# Patient Record
Sex: Female | Born: 1955 | Race: White | Hispanic: No | Marital: Married | Smoking: Former smoker
Health system: Southern US, Community
[De-identification: ages and names within clinical notes are randomized; demographics above are authoritative.]

## PROBLEM LIST (undated history)

## (undated) DIAGNOSIS — F419 Anxiety disorder, unspecified: Secondary | ICD-10-CM

## (undated) DIAGNOSIS — F329 Major depressive disorder, single episode, unspecified: Secondary | ICD-10-CM

## (undated) DIAGNOSIS — C801 Malignant (primary) neoplasm, unspecified: Secondary | ICD-10-CM

## (undated) DIAGNOSIS — M1990 Unspecified osteoarthritis, unspecified site: Secondary | ICD-10-CM

## (undated) DIAGNOSIS — K219 Gastro-esophageal reflux disease without esophagitis: Secondary | ICD-10-CM

## (undated) DIAGNOSIS — F32A Depression, unspecified: Secondary | ICD-10-CM

## (undated) HISTORY — PX: JOINT REPLACEMENT: SHX530

## (undated) HISTORY — PX: MENISCUS REPAIR: SHX5179

## (undated) HISTORY — PX: DILATION AND CURETTAGE OF UTERUS: SHX78

## (undated) HISTORY — PX: TUBAL LIGATION: SHX77

## (undated) HISTORY — PX: ABDOMINAL HYSTERECTOMY: SHX81

---

## 2012-09-17 ENCOUNTER — Other Ambulatory Visit: Payer: Self-pay | Admitting: Rheumatology

## 2012-09-17 DIAGNOSIS — L4050 Arthropathic psoriasis, unspecified: Secondary | ICD-10-CM

## 2012-09-20 ENCOUNTER — Ambulatory Visit
Admission: RE | Admit: 2012-09-20 | Discharge: 2012-09-20 | Disposition: A | Discharge status: Home / Self Care | Payer: BC Managed Care – PPO | Source: Ambulatory Visit | Attending: Rheumatology | Admitting: Rheumatology

## 2012-09-20 DIAGNOSIS — L4050 Arthropathic psoriasis, unspecified: Secondary | ICD-10-CM

## 2012-09-20 MED ORDER — GADOBENATE DIMEGLUMINE 529 MG/ML IV SOLN
17.0000 mL | Freq: Once | INTRAVENOUS | Status: AC | PRN
  Administered 2012-09-20: 17 mL via INTRAVENOUS

## 2013-02-07 ENCOUNTER — Other Ambulatory Visit: Payer: Self-pay

## 2013-02-07 DIAGNOSIS — Z1231 Encounter for screening mammogram for malignant neoplasm of breast: Secondary | ICD-10-CM

## 2013-03-01 ENCOUNTER — Ambulatory Visit
Admission: RE | Admit: 2013-03-01 | Discharge: 2013-03-01 | Disposition: A | Discharge status: Home / Self Care | Payer: BC Managed Care – PPO | Source: Ambulatory Visit

## 2013-03-01 DIAGNOSIS — Z1231 Encounter for screening mammogram for malignant neoplasm of breast: Secondary | ICD-10-CM

## 2013-10-16 ENCOUNTER — Encounter (HOSPITAL_COMMUNITY): Payer: Self-pay | Admitting: Pharmacy Technician

## 2013-10-17 NOTE — Pre-Procedure Instructions (Signed)
Rebecca Huff  10/17/2013   Your procedure is scheduled on:  Tuesday, October 29, 2013   Report to [facility] Short Stay (use Main Entrance "A'') at 5:30 AM.   Call this number if you have problems the morning of surgery: [phone]   Remember:   Do not eat food or drink liquids after midnight Monday.    Take these medicines the morning of surgery with A SIP OF WATER: sertraline (ZOLOFT) 50 MG tablet If needed:acetaminophen (TYLENOL) 500 MG tablet  for mild pain., traMADol (ULTRAM) 50 MG tablet for moderate pain. Stop taking Aspirin, vitamins and herbal medications. Do not take any NSAIDs ie: Ibuprofen, Advil, Naproxen or any medication containing Aspirin (diclofenac (VOLTAREN) 75 MG EC tablet)   Do not wear jewelry, make-up or nail polish.  Do not wear lotions, powders, or perfumes. You may wear deodorant.  Do not shave 48 hours prior to surgery.    Do not bring valuables to the hospital.  [facility] is not responsible for any belongings or valuables.               Contacts, dentures or bridgework may not be worn into surgery.  Leave suitcase in the car. After surgery it may be brought to your room.  For patients admitted to the hospital, discharge time is determined by your treatment team.               Patients discharged the day of surgery will not be allowed to drive home.   Name and phone number of your driver: MINNIE BELCHER -  MOM   Special Instructions: Shower using CHG 2 nights before surgery and the night before surgery.  If you shower the day of surgery use CHG.  Use special wash - you have one bottle of CHG for all showers.  You should use approximately 1/3 of the bottle for each shower.   Please read over the following fact sheets that you were given: Pain Booklet, Coughing and Deep Breathing, Blood Transfusion Information, Total Joint Packet, MRSA Information and Surgical Site Infection Prevention

## 2013-10-17 NOTE — H&P (Signed)
CHIEF COMPLAINT:  Painful left knee.   HISTORY OF PRESENT ILLNESS:  Rebecca Huff is a very pleasant 57 year old white female who is seen today for evaluation of her left knee.  She has undergone a previous arthroscopy in December of 2013 with a medial and lateral partial meniscectomy, but she had a pretty significant osteoarthritis at that time more medial than lateral.  Initially she did well.  She also used Voltaren gel which was also beneficial, but after continued use it had become ineffective.  She has had cortisone injections performed and also that of viscosupplementation.  She has even tried Humira and Enbrel in the past.  She has undergone multiple treatments and multiple dosage of the viscosupplementation and cortisone.  Most recently though she is refractory to almost any type of treatment.  She is not having pain with every step.  Nighttime pain keeps her wake.  She also has pain with activities of daily living.  She has also noted that she does not have quite as much range of motion.  She is not having swelling.  Her pain is moderately severe, has more of an aching, throbbing pain with occasional catching of the knee.  She is seen today for re-evaluation.     Her health history and general health is good.     PAST SURGICAL HISTORY:   In 1985 and 1988 cesarean sections.  Total abdominal hysterectomy with bilateral salpingo-oophorectomy in 2004.  November 22, 2012 left knee arthroscopy with debridement.     HOSPITALIZATIONS:   In 1990 for a kidney infection.   CURRENT MEDICATIONS:   Zoloft 50 mg daily, lorazepam 0.5 mg 1-1/2 tabs HS, diclofenac 75 mg b.i.d., tramadol 50 mg 2-3 times per week p.r.n., Nicotrol inhaler she uses occasionally.  Tylenol p.r.n.   ALLERGIES:   Sulfa which gives her hives and an itch.   REVIEW OF SYSTEMS:   A 14-point review of systems positive for glasses and occasional leg cramps.  She also has some problems with constipation.  She does have hot flashes at times.  She  also noticed some weight gain.  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     FAMILY HISTORY:   A mother is still alive at age 103 who has had a microinfarction in the past as well as hypertension.  Biologic father died at age 74 from a logging accident.  She has no brothers.  She has 3 half sisters age 3, 48, 50.  The 10 year old sister has had breast cancer.     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  She smokes now less than 6 cigarettes per day and she smoked a total of 35 years.  She drinks 1-2 times a week mainly that of red wine.     PHYSICAL EXAMINATION:  A 57 year old white female, well-developed, well-nourished, alert, pleasant, and cooperative in moderate distress secondary to left knee pain.     Height is 68 inches.  Weight is 191 pounds.  BMI is 29.     Vital signs reveal a temperature of 98, pulse 96, respirations 16, blood pressure 126/84.     Head is normocephalic.   Eyes:  Pupils equal, round, reactive to light and accommodation with extraocular movements intact.  Ears, nose and throat were benign. Chest had good expansion. Lungs are clear.   Cardiac had a regular rhythm and rate S1, S2 and no murmurs noted. Pulses were 2+ bilateral and symmetric in  the lower extremities. CNS:   She is oriented x3 and cranial nerves II-XII grossly intact. Abdomen:  Scaphoid is soft, nontender, no mass is palpable.  Normal bowel sounds are present. Genital, rectal and breast exam was not indicated for a orthopedic evaluation.   Musculoskeletal:  She has range of motion from about 2-3 degrees shy of full extension to about 110 degrees.  She does have crepitus with range of motion.  Diffuse tenderness about the knee.  More medial compartment than lateral.  She does have some pseudolaxity with varus and valgus stressing.     CLINICAL IMPRESSION:   1.  End-stage osteoarthritis of the left knee medial greater than  lateral. 2.  Cigarette smoker.   RECOMMENDATIONS:  At this time I reviewed a evaluation from [facility] that of Dr. Laurann Huff who felt that she was a candidate for total joint replacement from medical and cardiac clearance.  At this time include that of a left total knee replacement since she has failed conservative treatment.  The procedure, risks and benefits were fully explained to her and she is understanding.  I have demonstrated the surgical intervention using the prosthesis and the knee model.  All questions were answered.  I went over the entire hospital stay.  She is understanding.  All questions were answered.  Plan for left total knee arthroplasty in the near future.    Oris Drone Aleda Grana [facility] [phone]  10/17/2013 4:14 PM

## 2013-10-18 ENCOUNTER — Encounter (HOSPITAL_COMMUNITY)
Admission: RE | Admit: 2013-10-18 | Discharge: 2013-10-18 | Disposition: A | Discharge status: Home / Self Care | Payer: BC Managed Care – PPO | Source: Ambulatory Visit | Attending: Orthopedic Surgery | Admitting: Orthopedic Surgery

## 2013-10-18 ENCOUNTER — Encounter (HOSPITAL_COMMUNITY)
Admission: RE | Admit: 2013-10-18 | Discharge: 2013-10-18 | Disposition: A | Discharge status: Home / Self Care | Payer: BC Managed Care – PPO | Source: Ambulatory Visit | Attending: Orthopaedic Surgery | Admitting: Orthopaedic Surgery

## 2013-10-18 ENCOUNTER — Encounter (HOSPITAL_COMMUNITY): Payer: Self-pay

## 2013-10-18 DIAGNOSIS — Z01818 Encounter for other preprocedural examination: Secondary | ICD-10-CM | POA: Insufficient documentation

## 2013-10-18 DIAGNOSIS — Z01812 Encounter for preprocedural laboratory examination: Secondary | ICD-10-CM | POA: Insufficient documentation

## 2013-10-18 DIAGNOSIS — Z01810 Encounter for preprocedural cardiovascular examination: Secondary | ICD-10-CM | POA: Insufficient documentation

## 2013-10-18 HISTORY — DX: Anxiety disorder, unspecified: F41.9

## 2013-10-18 HISTORY — DX: Unspecified osteoarthritis, unspecified site: M19.90

## 2013-10-18 LAB — COMPREHENSIVE METABOLIC PANEL
AST: 24 U/L (ref 0–37)
Albumin: 3.8 g/dL (ref 3.5–5.2)
Alkaline Phosphatase: 80 U/L (ref 39–117)
BUN: 17 mg/dL (ref 6–23)
Calcium: 9.1 mg/dL (ref 8.4–10.5)
Creatinine, Ser: 0.78 mg/dL (ref 0.50–1.10)
GFR calc Af Amer: >90 mL/min (ref >90)
Glucose, Bld: 100 mg/dL — ABNORMAL HIGH (ref 70–99)
Potassium: 4 mEq/L (ref 3.5–5.1)
Sodium: 141 mEq/L (ref 135–145)
Total Protein: 7.1 g/dL (ref 6.0–8.3)

## 2013-10-18 LAB — PROTIME-INR: INR: 0.91 (ref 0.00–1.49)

## 2013-10-18 LAB — URINALYSIS, ROUTINE W REFLEX MICROSCOPIC
Bilirubin Urine: NEGATIVE
Glucose, UA: NEGATIVE mg/dL
Hgb urine dipstick: NEGATIVE
Ketones, ur: NEGATIVE mg/dL
Leukocytes, UA: NEGATIVE
Urobilinogen, UA: 0.2 mg/dL (ref 0.0–1.0)
pH: 5 (ref 5.0–8.0)

## 2013-10-18 LAB — SURGICAL PCR SCREEN: Staphylococcus aureus: NEGATIVE

## 2013-10-18 LAB — CBC
HCT: 41.8 % (ref 36.0–46.0)
Hemoglobin: 14.5 g/dL (ref 12.0–15.0)
MCH: 31.5 pg (ref 26.0–34.0)
MCHC: 34.7 g/dL (ref 30.0–36.0)
MCV: 90.7 fL (ref 78.0–100.0)
Platelets: 225 10*3/uL (ref 150–400)
RBC: 4.61 MIL/uL (ref 3.87–5.11)
RDW: 12.7 % (ref 11.5–15.5)
WBC: 6.2 10*3/uL (ref 4.0–10.5)

## 2013-10-19 LAB — URINE CULTURE
Colony Count: NO GROWTH
Culture: NO GROWTH

## 2013-10-28 MED ORDER — CEFAZOLIN SODIUM-DEXTROSE 2-3 GM-% IV SOLR
2.0000 g | INTRAVENOUS | Status: AC
  Administered 2013-10-29: 2 g via INTRAVENOUS
  Filled 2013-10-28: qty 50

## 2013-10-29 ENCOUNTER — Inpatient Hospital Stay (HOSPITAL_COMMUNITY): Payer: BC Managed Care – PPO | Admitting: Anesthesiology

## 2013-10-29 ENCOUNTER — Encounter (HOSPITAL_COMMUNITY): Payer: BC Managed Care – PPO | Admitting: Anesthesiology

## 2013-10-29 ENCOUNTER — Inpatient Hospital Stay (HOSPITAL_COMMUNITY)
Admission: RE | Admit: 2013-10-29 | Discharge: 2013-10-31 | DRG: 470 | Disposition: A | Discharge status: Home / Self Care | Payer: BC Managed Care – PPO | Source: Ambulatory Visit | Attending: Orthopaedic Surgery | Admitting: Orthopaedic Surgery

## 2013-10-29 ENCOUNTER — Encounter (HOSPITAL_COMMUNITY)
Admission: RE | Disposition: A | Discharge status: Home / Self Care | Payer: Self-pay | Source: Ambulatory Visit | Attending: Orthopaedic Surgery

## 2013-10-29 ENCOUNTER — Encounter (HOSPITAL_COMMUNITY): Payer: Self-pay | Admitting: Surgery

## 2013-10-29 DIAGNOSIS — F411 Generalized anxiety disorder: Secondary | ICD-10-CM | POA: Diagnosis present

## 2013-10-29 DIAGNOSIS — Z79899 Other long term (current) drug therapy: Secondary | ICD-10-CM

## 2013-10-29 DIAGNOSIS — Z882 Allergy status to sulfonamides status: Secondary | ICD-10-CM

## 2013-10-29 DIAGNOSIS — Z803 Family history of malignant neoplasm of breast: Secondary | ICD-10-CM

## 2013-10-29 DIAGNOSIS — Z8249 Family history of ischemic heart disease and other diseases of the circulatory system: Secondary | ICD-10-CM

## 2013-10-29 DIAGNOSIS — M1710 Unilateral primary osteoarthritis, unspecified knee: Principal | ICD-10-CM | POA: Diagnosis present

## 2013-10-29 DIAGNOSIS — F17200 Nicotine dependence, unspecified, uncomplicated: Secondary | ICD-10-CM | POA: Diagnosis present

## 2013-10-29 DIAGNOSIS — M1712 Unilateral primary osteoarthritis, left knee: Secondary | ICD-10-CM

## 2013-10-29 DIAGNOSIS — Z7901 Long term (current) use of anticoagulants: Secondary | ICD-10-CM

## 2013-10-29 DIAGNOSIS — Z96659 Presence of unspecified artificial knee joint: Secondary | ICD-10-CM

## 2013-10-29 HISTORY — PX: TOTAL KNEE ARTHROPLASTY: SHX125

## 2013-10-29 SURGERY — ARTHROPLASTY, KNEE, TOTAL
Anesthesia: Regional | Site: Knee | Laterality: Left

## 2013-10-29 MED ORDER — ONDANSETRON HCL 4 MG PO TABS: 4.0000 mg | ORAL_TABLET | Freq: Four times a day (QID) | ORAL | Status: DC | PRN

## 2013-10-29 MED ORDER — LIDOCAINE HCL (CARDIAC) 20 MG/ML IV SOLN
INTRAVENOUS | Status: DC | PRN
  Administered 2013-10-29: 80 mg via INTRAVENOUS

## 2013-10-29 MED ORDER — ACETAMINOPHEN 325 MG PO TABS
650.0000 mg | ORAL_TABLET | Freq: Four times a day (QID) | ORAL | Status: DC | PRN
  Administered 2013-10-30 – 2013-10-31 (×2): 650 mg via ORAL
  Filled 2013-10-29 (×2): qty 2

## 2013-10-29 MED ORDER — CEFAZOLIN SODIUM-DEXTROSE 2-3 GM-% IV SOLR
2.0000 g | Freq: Four times a day (QID) | INTRAVENOUS | Status: AC
  Administered 2013-10-29 (×2): 2 g via INTRAVENOUS
  Filled 2013-10-29 (×3): qty 50

## 2013-10-29 MED ORDER — LACTATED RINGERS IV SOLN
INTRAVENOUS | Status: DC | PRN
  Administered 2013-10-29 (×2): via INTRAVENOUS

## 2013-10-29 MED ORDER — MAGNESIUM HYDROXIDE 400 MG/5ML PO SUSP: 30.0000 mL | Freq: Every day | ORAL | Status: DC | PRN

## 2013-10-29 MED ORDER — KETOROLAC TROMETHAMINE 15 MG/ML IJ SOLN
15.0000 mg | Freq: Four times a day (QID) | INTRAMUSCULAR | Status: AC
  Administered 2013-10-29 (×2): 15 mg via INTRAVENOUS
  Filled 2013-10-29 (×2): qty 1

## 2013-10-29 MED ORDER — METHOCARBAMOL 500 MG PO TABS
500.0000 mg | ORAL_TABLET | Freq: Four times a day (QID) | ORAL | Status: DC | PRN
  Administered 2013-10-29 – 2013-10-31 (×8): 500 mg via ORAL
  Filled 2013-10-29 (×10): qty 1

## 2013-10-29 MED ORDER — FENTANYL CITRATE 0.05 MG/ML IJ SOLN
INTRAMUSCULAR | Status: DC | PRN
  Administered 2013-10-29: 50 ug via INTRAVENOUS
  Administered 2013-10-29 (×8): 25 ug via INTRAVENOUS
  Administered 2013-10-29: 50 ug via INTRAVENOUS

## 2013-10-29 MED ORDER — RIVAROXABAN 10 MG PO TABS
10.0000 mg | ORAL_TABLET | Freq: Every day | ORAL | Status: DC
  Administered 2013-10-29 – 2013-10-30 (×2): 10 mg via ORAL
  Filled 2013-10-29 (×3): qty 1

## 2013-10-29 MED ORDER — LORAZEPAM 0.5 MG PO TABS: 0.7500 mg | ORAL_TABLET | Freq: Every evening | ORAL | Status: DC | PRN

## 2013-10-29 MED ORDER — ACETAMINOPHEN 500 MG PO TABS
1000.0000 mg | ORAL_TABLET | Freq: Once | ORAL | Status: AC
  Administered 2013-10-29: 1000 mg via ORAL
  Filled 2013-10-29: qty 2

## 2013-10-29 MED ORDER — METHOCARBAMOL 500 MG PO TABS
ORAL_TABLET | ORAL | Status: AC
  Administered 2013-10-29: 500 mg
  Filled 2013-10-29: qty 1

## 2013-10-29 MED ORDER — HYDROMORPHONE HCL PF 1 MG/ML IJ SOLN
INTRAMUSCULAR | Status: AC
  Administered 2013-10-29: 11:00:00
  Filled 2013-10-29: qty 1

## 2013-10-29 MED ORDER — OXYCODONE HCL 5 MG PO TABS
ORAL_TABLET | ORAL | Status: AC
  Administered 2013-10-29: 11:00:00
  Filled 2013-10-29: qty 1

## 2013-10-29 MED ORDER — ALUM & MAG HYDROXIDE-SIMETH 200-200-20 MG/5ML PO SUSP: 30.0000 mL | ORAL | Status: DC | PRN

## 2013-10-29 MED ORDER — CHLORHEXIDINE GLUCONATE 4 % EX LIQD: 60.0000 mL | Freq: Every day | CUTANEOUS | Status: DC

## 2013-10-29 MED ORDER — MENTHOL 3 MG MT LOZG: 1.0000 | LOZENGE | OROMUCOSAL | Status: DC | PRN

## 2013-10-29 MED ORDER — FLEET ENEMA 7-19 GM/118ML RE ENEM: 1.0000 | ENEMA | Freq: Once | RECTAL | Status: AC | PRN

## 2013-10-29 MED ORDER — PHENOL 1.4 % MT LIQD: 1.0000 | OROMUCOSAL | Status: DC | PRN

## 2013-10-29 MED ORDER — ONDANSETRON HCL 4 MG/2ML IJ SOLN: 4.0000 mg | Freq: Four times a day (QID) | INTRAMUSCULAR | Status: DC | PRN

## 2013-10-29 MED ORDER — BUPIVACAINE-EPINEPHRINE 0.25% -1:200000 IJ SOLN
INTRAMUSCULAR | Status: DC | PRN
  Administered 2013-10-29: 30 mL

## 2013-10-29 MED ORDER — SODIUM CHLORIDE 0.9 % IR SOLN
Status: DC | PRN
  Administered 2013-10-29: 3000 mL

## 2013-10-29 MED ORDER — HYDROMORPHONE HCL PF 1 MG/ML IJ SOLN
0.5000 mg | INTRAMUSCULAR | Status: DC | PRN
  Administered 2013-10-29 (×2): 0.5 mg via INTRAVENOUS
  Filled 2013-10-29 (×3): qty 1

## 2013-10-29 MED ORDER — ONDANSETRON HCL 4 MG/2ML IJ SOLN
INTRAMUSCULAR | Status: DC | PRN
  Administered 2013-10-29: 4 mg via INTRAVENOUS

## 2013-10-29 MED ORDER — METOCLOPRAMIDE HCL 5 MG/ML IJ SOLN: 5.0000 mg | Freq: Three times a day (TID) | INTRAMUSCULAR | Status: DC | PRN

## 2013-10-29 MED ORDER — SERTRALINE HCL 50 MG PO TABS
50.0000 mg | ORAL_TABLET | Freq: Every day | ORAL | Status: DC
  Administered 2013-10-29 – 2013-10-31 (×2): 50 mg via ORAL
  Filled 2013-10-29 (×3): qty 1

## 2013-10-29 MED ORDER — ARTIFICIAL TEARS OP OINT
TOPICAL_OINTMENT | OPHTHALMIC | Status: DC | PRN
  Administered 2013-10-29: 1 via OPHTHALMIC

## 2013-10-29 MED ORDER — DOCUSATE SODIUM 100 MG PO CAPS
100.0000 mg | ORAL_CAPSULE | Freq: Two times a day (BID) | ORAL | Status: DC
  Administered 2013-10-29 – 2013-10-31 (×5): 100 mg via ORAL
  Filled 2013-10-29 (×7): qty 1

## 2013-10-29 MED ORDER — BUPIVACAINE-EPINEPHRINE (PF) 0.25% -1:200000 IJ SOLN
INTRAMUSCULAR | Status: AC
  Filled 2013-10-29: qty 30

## 2013-10-29 MED ORDER — BISACODYL 10 MG RE SUPP: 10.0000 mg | Freq: Every day | RECTAL | Status: DC | PRN

## 2013-10-29 MED ORDER — DEXTROSE 5 % IV SOLN
500.0000 mg | Freq: Four times a day (QID) | INTRAVENOUS | Status: DC | PRN
  Filled 2013-10-29: qty 5

## 2013-10-29 MED ORDER — OXYCODONE HCL 5 MG PO TABS
5.0000 mg | ORAL_TABLET | ORAL | Status: DC | PRN
  Administered 2013-10-29 (×2): 5 mg via ORAL
  Administered 2013-10-30 – 2013-10-31 (×10): 10 mg via ORAL
  Filled 2013-10-29 (×9): qty 2
  Filled 2013-10-29: qty 1
  Filled 2013-10-29 (×3): qty 2

## 2013-10-29 MED ORDER — OXYCODONE HCL 5 MG/5ML PO SOLN: 5.0000 mg | Freq: Once | ORAL | Status: AC | PRN

## 2013-10-29 MED ORDER — CHLORHEXIDINE GLUCONATE 4 % EX LIQD: 60.0000 mL | Freq: Once | CUTANEOUS | Status: DC

## 2013-10-29 MED ORDER — KETOROLAC TROMETHAMINE 30 MG/ML IJ SOLN
INTRAMUSCULAR | Status: AC
  Administered 2013-10-29: 30 mg
  Filled 2013-10-29: qty 1

## 2013-10-29 MED ORDER — SODIUM CHLORIDE 0.9 % IV SOLN
75.0000 mL/h | INTRAVENOUS | Status: DC
  Administered 2013-10-29 – 2013-10-30 (×2): 75 mL/h via INTRAVENOUS

## 2013-10-29 MED ORDER — OXYCODONE HCL 5 MG PO TABS
5.0000 mg | ORAL_TABLET | Freq: Once | ORAL | Status: AC | PRN
  Administered 2013-10-29: 5 mg via ORAL

## 2013-10-29 MED ORDER — METOCLOPRAMIDE HCL 10 MG PO TABS: 5.0000 mg | ORAL_TABLET | Freq: Three times a day (TID) | ORAL | Status: DC | PRN

## 2013-10-29 MED ORDER — SODIUM CHLORIDE 0.9 % IV SOLN: INTRAVENOUS | Status: DC

## 2013-10-29 MED ORDER — MIDAZOLAM HCL 5 MG/5ML IJ SOLN
INTRAMUSCULAR | Status: DC | PRN
  Administered 2013-10-29 (×2): 1 mg via INTRAVENOUS

## 2013-10-29 MED ORDER — PHENYLEPHRINE HCL 10 MG/ML IJ SOLN
INTRAMUSCULAR | Status: DC | PRN
  Administered 2013-10-29: 40 ug via INTRAVENOUS

## 2013-10-29 MED ORDER — HYDROMORPHONE HCL PF 1 MG/ML IJ SOLN
0.2500 mg | INTRAMUSCULAR | Status: DC | PRN
  Administered 2013-10-29 (×2): 0.5 mg via INTRAVENOUS

## 2013-10-29 MED ORDER — PROPOFOL 10 MG/ML IV BOLUS
INTRAVENOUS | Status: DC | PRN
  Administered 2013-10-29: 170 mg via INTRAVENOUS

## 2013-10-29 MED ORDER — ACETAMINOPHEN 650 MG RE SUPP: 650.0000 mg | Freq: Four times a day (QID) | RECTAL | Status: DC | PRN

## 2013-10-29 SURGICAL SUPPLY — 58 items
BANDAGE ESMARK 6X9 LF (GAUZE/BANDAGES/DRESSINGS) ×1 IMPLANT
BLADE SAGITTAL 25.0X1.19X90 (BLADE) ×2 IMPLANT
BNDG ESMARK 6X9 LF (GAUZE/BANDAGES/DRESSINGS) ×2
BOWL SMART MIX CTS (DISPOSABLE) ×2 IMPLANT
CAPT RP KNEE ×2 IMPLANT
CEMENT HV SMART SET (Cement) ×4 IMPLANT
CLOTH BEACON ORANGE TIMEOUT ST (SAFETY) ×2 IMPLANT
COVER BACK TABLE 24X17X13 BIG (DRAPES) ×2 IMPLANT
COVER SURGICAL LIGHT HANDLE (MISCELLANEOUS) ×2 IMPLANT
CUFF TOURNIQUET SINGLE 34IN LL (TOURNIQUET CUFF) IMPLANT
CUFF TOURNIQUET SINGLE 44IN (TOURNIQUET CUFF) IMPLANT
DRAPE EXTREMITY T 121X128X90 (DRAPE) ×2 IMPLANT
DRAPE PROXIMA HALF (DRAPES) ×2 IMPLANT
DRSG ADAPTIC 3X8 NADH LF (GAUZE/BANDAGES/DRESSINGS) ×2 IMPLANT
DRSG PAD ABDOMINAL 8X10 ST (GAUZE/BANDAGES/DRESSINGS) ×2 IMPLANT
DURAPREP 26ML APPLICATOR (WOUND CARE) ×2 IMPLANT
ELECT CAUTERY BLADE 6.4 (BLADE) ×2 IMPLANT
ELECT REM PT RETURN 9FT ADLT (ELECTROSURGICAL) ×2
ELECTRODE REM PT RTRN 9FT ADLT (ELECTROSURGICAL) ×1 IMPLANT
EVACUATOR 1/8 PVC DRAIN (DRAIN) IMPLANT
FACESHIELD LNG OPTICON STERILE (SAFETY) ×4 IMPLANT
FLOSEAL 10ML (HEMOSTASIS) IMPLANT
GLOVE BIOGEL PI IND STRL 8 (GLOVE) ×1 IMPLANT
GLOVE BIOGEL PI IND STRL 8.5 (GLOVE) ×1 IMPLANT
GLOVE BIOGEL PI INDICATOR 8 (GLOVE) ×1
GLOVE BIOGEL PI INDICATOR 8.5 (GLOVE) ×1
GLOVE ECLIPSE 8.0 STRL XLNG CF (GLOVE) ×6 IMPLANT
GLOVE SURG ORTHO 8.5 STRL (GLOVE) ×6 IMPLANT
GOWN PREVENTION PLUS XXLARGE (GOWN DISPOSABLE) ×2 IMPLANT
GOWN STRL NON-REIN LRG LVL3 (GOWN DISPOSABLE) ×4 IMPLANT
HANDPIECE INTERPULSE COAX TIP (DISPOSABLE) ×1
KIT BASIN OR (CUSTOM PROCEDURE TRAY) ×2 IMPLANT
KIT ROOM TURNOVER OR (KITS) ×2 IMPLANT
MANIFOLD NEPTUNE II (INSTRUMENTS) ×2 IMPLANT
NEEDLE 22X1 1/2 (OR ONLY) (NEEDLE) IMPLANT
NS IRRIG 1000ML POUR BTL (IV SOLUTION) ×2 IMPLANT
PACK TOTAL JOINT (CUSTOM PROCEDURE TRAY) ×2 IMPLANT
PAD ARMBOARD 7.5X6 YLW CONV (MISCELLANEOUS) ×4 IMPLANT
PAD CAST 4YDX4 CTTN HI CHSV (CAST SUPPLIES) ×1 IMPLANT
PADDING CAST COTTON 4X4 STRL (CAST SUPPLIES) ×1
PADDING CAST COTTON 6X4 STRL (CAST SUPPLIES) ×2 IMPLANT
SET HNDPC FAN SPRY TIP SCT (DISPOSABLE) ×1 IMPLANT
SPONGE GAUZE 4X4 12PLY (GAUZE/BANDAGES/DRESSINGS) ×2 IMPLANT
STAPLER VISISTAT 35W (STAPLE) ×2 IMPLANT
SUCTION FRAZIER TIP 10 FR DISP (SUCTIONS) ×2 IMPLANT
SUT BONE WAX W31G (SUTURE) ×2 IMPLANT
SUT ETHIBOND NAB CT1 #1 30IN (SUTURE) ×6 IMPLANT
SUT MNCRL AB 3-0 PS2 18 (SUTURE) ×2 IMPLANT
SUT VIC AB 0 CT1 27 (SUTURE) ×1
SUT VIC AB 0 CT1 27XBRD ANBCTR (SUTURE) ×1 IMPLANT
SUT VIC AB 1 CT1 27 (SUTURE) ×1
SUT VIC AB 1 CT1 27XBRD ANBCTR (SUTURE) ×1 IMPLANT
SYR CONTROL 10ML LL (SYRINGE) IMPLANT
TOWEL OR 17X24 6PK STRL BLUE (TOWEL DISPOSABLE) ×2 IMPLANT
TOWEL OR 17X26 10 PK STRL BLUE (TOWEL DISPOSABLE) ×2 IMPLANT
TRAY FOLEY CATH 16FRSI W/METER (SET/KITS/TRAYS/PACK) IMPLANT
WATER STERILE IRR 1000ML POUR (IV SOLUTION) ×4 IMPLANT
WRAP KNEE MAXI GEL POST OP (GAUZE/BANDAGES/DRESSINGS) ×2 IMPLANT

## 2013-10-29 NOTE — Anesthesia Procedure Notes (Addendum)
Anesthesia Regional Block:  Femoral nerve block  Pre-Anesthetic Checklist: ,, timeout performed, Correct Patient, Correct Site, Correct Laterality, Correct Procedure, Correct Position, site marked, Risks and benefits discussed,  Surgical consent,  Pre-op evaluation,  At surgeon's request and post-op pain management  Laterality: Left  Prep: chloraprep       Needles:  Injection technique: Single-shot  Needle Type: Echogenic Stimulator Needle     Needle Length: 5cm 5 cm Needle Gauge: 22 and 22 G    Additional Needles:  Procedures: ultrasound guided (picture in chart) and nerve stimulator Femoral nerve block  Nerve Stimulator or Paresthesia:  Response: quadraceps contraction, 0.45 mA,   Additional Responses:   Narrative:  Start time: 10/29/2013 6:52 AM End time: 10/29/2013 7:02 AM Injection made incrementally with aspirations every 5 mL.  Performed by: Personally  Anesthesiologist: Halford Decamp, MD  Additional Notes: Functioning IV was confirmed and monitors were applied.  A 50mm 22ga Arrow echogenic stimulator needle was used. Sterile prep and drape,hand hygiene and sterile gloves were used. Ultrasound guidance: relevant anatomy identified, needle position confirmed, local anesthetic spread visualized around nerve(s)., vascular puncture avoided.  Image printed for medical record. Negative aspiration and negative test dose prior to incremental administration of local anesthetic. The patient tolerated the procedure well.     Anesthesia Procedure Image  Procedure Name: LMA Insertion Date/Time: 10/29/2013 7:35 AM Performed by: Gayla Medicus Pre-anesthesia Checklist: Patient identified, Timeout performed, Emergency Drugs available, Suction available and Patient being monitored Patient Re-evaluated:Patient Re-evaluated prior to inductionOxygen Delivery Method: Circle system utilized Preoxygenation: Pre-oxygenation with 100% oxygen Intubation Type: IV induction LMA: LMA  inserted LMA Size: 4.0 Number of attempts: 1 Placement Confirmation: positive ETCO2 and breath sounds checked- equal and bilateral Tube secured with: Tape Dental Injury: Teeth and Oropharynx as per pre-operative assessment

## 2013-10-29 NOTE — Progress Notes (Signed)
Orthopedic Tech Progress Note Patient Details:  Rebecca Huff [birth year] [id number] CPM applied to Left LE with appropriate settings. OHF applied to bed. Footsie roll provided.  CPM Left Knee CPM Left Knee: On Left Knee Flexion (Degrees): 60 Left Knee Extension (Degrees): 0   Asia R Thompson 10/29/2013, 10:50 AM

## 2013-10-29 NOTE — Evaluation (Signed)
Physical Therapy Evaluation Patient Details Name: Rebecca Huff MRN: [id number] DOB: [birth year] Today's Date: 10/29/2013 Time: 1660-6301 PT Time Calculation (min): 27 min  PT Assessment / Plan / Recommendation History of Present Illness  S/p LTKA; 50% PWB  Clinical Impression  Pt is s/p TKA resulting in the deficits listed below (see PT Problem List).  Pt will benefit from skilled PT to increase their independence and safety with mobility to allow discharge to the venue listed below.      PT Assessment  Patient needs continued PT services    Follow Up Recommendations  Home health PT;Supervision/Assistance - 24 hour    Does the patient have the potential to tolerate intense rehabilitation      Barriers to Discharge        Equipment Recommendations  3in1 (PT)    Recommendations for Other Services OT consult   Frequency 7X/week    Precautions / Restrictions Precautions Precautions: Knee Restrictions Weight Bearing Restrictions: Yes LLE Weight Bearing: Partial weight bearing LLE Partial Weight Bearing Percentage or Pounds: 50   Pertinent Vitals/Pain 5/10 pain with moving; was premedicated patient repositioned for comfort and optimal knee extension      Mobility  Bed Mobility Bed Mobility: Supine to Sit;Sitting - Scoot to Edge of Bed Supine to Sit: 4: Min assist Sitting - Scoot to [place] of Bed: 4: Min assist Details for Bed Mobility Assistance: Cues for technique, and physical assist to help LLE off of bed Transfers Transfers: Sit to Stand;Stand to Sit Sit to Stand: 3: Mod assist Stand to Sit: 4: Min assist Details for Transfer Assistance: Cues for safety and hand placement Ambulation/Gait Ambulation/Gait Assistance: 4: Min assist Ambulation Distance (Feet): 3 Feet Assistive device: Rolling walker Ambulation/Gait Assistance Details: pivot steps bed to recliner; cues for gait sequence and 50%PWB Gait Pattern: Step-to pattern    Exercises Total Joint  Exercises Quad Sets: AROM;Left;5 reps Straight Leg Raises: AAROM;Left;Other reps (comment) (3)   PT Diagnosis: Difficulty walking;Acute pain  PT Problem List: Decreased strength;Decreased range of motion;Decreased activity tolerance;Decreased knowledge of use of DME;Decreased knowledge of precautions;Pain PT Treatment Interventions: DME instruction;Gait training;Stair training;Functional mobility training;Therapeutic activities;Therapeutic exercise;Patient/family education     PT Goals(Current goals can be found in the care plan section) Acute Rehab PT Goals Patient Stated Goal: get back to doing whatever she wants PT Goal Formulation: With patient Time For Goal Achievement: 11/05/13 Potential to Achieve Goals: Good  Visit Information  Last PT Received On: 10/29/13 Assistance Needed: +1 History of Present Illness: S/p LTKA; 50% PWB       Prior Functioning  Home Living Family/patient expects to be discharged to:: Private residence Living Arrangements: Children Available Help at Discharge: Family;Available 24 hours/day Type of Home: Apartment Home Access: Stairs to enter Entrance Stairs-Number of Steps: 1 Entrance Stairs-Rails: None Home Layout: One level Home Equipment: Walker - 2 wheels;Tub bench Prior Function Level of Independence: Independent Communication Communication: No difficulties    Cognition  Cognition Arousal/Alertness: Awake/alert Behavior During Therapy: WFL for tasks assessed/performed Overall Cognitive Status: Within Functional Limits for tasks assessed    Extremity/Trunk Assessment Upper Extremity Assessment Upper Extremity Assessment: Overall WFL for tasks assessed Lower Extremity Assessment Lower Extremity Assessment: LLE deficits/detail LLE Deficits / Details: Grossly decr ROM and strength, limited by pain and nerve block   Balance    End of Session PT - End of Session Equipment Utilized During Treatment: Gait belt Activity Tolerance: Patient  tolerated treatment well Patient left: in chair;with call bell/phone within reach;with family/visitor  present Nurse Communication: Mobility status CPM Left Knee CPM Left Knee: On  GP     Olen Pel [place], [place] [phone]  10/29/2013, 4:26 PM

## 2013-10-29 NOTE — Care Management Note (Signed)
Case Manager spoke with patient and family concerning Home Health and Equipment needs.Patient states she will be staying local for recovery. Daughter April Robertson-[phone], address [address] . Daughter states they have borrowed walker, 3in1 and have the CPM.

## 2013-10-29 NOTE — Plan of Care (Signed)
Problem: Consults Goal: Diagnosis- Total Joint Replacement Outcome: Completed/Met Date Met:  10/29/13 Primary Total Knee

## 2013-10-29 NOTE — Anesthesia Postprocedure Evaluation (Signed)
Anesthesia Post Note  Patient: Rebecca Huff  Procedure(s) Performed: Procedure(s) (LRB): LEFT TOTAL KNEE ARTHROPLASTY (Left)  Anesthesia type: General  Patient location: PACU  Post pain: Pain level controlled and Adequate analgesia  Post assessment: Post-op Vital signs reviewed, Patient's Cardiovascular Status Stable, Respiratory Function Stable, Patent Airway and Pain level controlled  Last Vitals:  Filed Vitals:   10/29/13 1030  BP:   Pulse: 83  Temp:   Resp: 16    Post vital signs: Reviewed and stable  Level of consciousness: awake, alert  and oriented  Complications: No apparent anesthesia complications

## 2013-10-29 NOTE — Preoperative (Signed)
Beta Blockers   Reason not to administer Beta Blockers:Not Applicable 

## 2013-10-29 NOTE — Anesthesia Preprocedure Evaluation (Signed)
Anesthesia Evaluation  Patient identified by MRN, date of birth, ID band Patient awake    Reviewed: Allergy & Precautions, H&P , NPO status , Patient's Chart, lab work & pertinent test results  Airway Mallampati: II  Neck ROM: full    Dental   Pulmonary Current Smoker,          Cardiovascular negative cardio ROS      Neuro/Psych Anxiety    GI/Hepatic   Endo/Other    Renal/GU      Musculoskeletal  (+) Arthritis -,   Abdominal   Peds  Hematology   Anesthesia Other Findings   Reproductive/Obstetrics                           Anesthesia Physical Anesthesia Plan  ASA: II  Anesthesia Plan: General and Regional   Post-op Pain Management: MAC Combined w/ Regional for Post-op pain   Induction: Intravenous  Airway Management Planned: LMA  Additional Equipment:   Intra-op Plan:   Post-operative Plan:   Informed Consent: I have reviewed the patients History and Physical, chart, labs and discussed the procedure including the risks, benefits and alternatives for the proposed anesthesia with the patient or authorized representative who has indicated his/her understanding and acceptance.     Plan Discussed with: CRNA, Anesthesiologist and Surgeon  Anesthesia Plan Comments:         Anesthesia Quick Evaluation

## 2013-10-29 NOTE — Op Note (Signed)
PATIENT ID:      Rebecca Huff  MRN:     [id number] DOB/AGE:    01/13/1956 / 57 y.o.       OPERATIVE REPORT    DATE OF PROCEDURE:  10/29/2013       PREOPERATIVE DIAGNOSIS:   LEFT KNEE OSTEOARTHRITIS-end stage                                                       There is no weight on file to calculate BMI.-29     POSTOPERATIVE DIAGNOSIS:   LEFT KNEE OSTEOARTHRITIS-same                                                                     There is no weight on file to calculate BMI.     PROCEDURE:  Procedure(s): LEFT TOTAL KNEE ARTHROPLASTY left     SURGEON:  Norlene Campbell, MD    ASSISTANT:   Jacqualine Code, PA-C   (Present and scrubbed throughout the case, critical for assistance with exposure, retraction, instrumentation, and closure.)          ANESTHESIA: regional and general     DRAINS: (left knee) Hemovact drain(s) in the clamped with  Suction Clamped :      TOURNIQUET TIME:  Total Tourniquet Time Documented: Thigh (Left) - 86 minutes Total: Thigh (Left) - 86 minutes     COMPLICATIONS:  None   CONDITION:  stable  PROCEDURE IN [id number]  [name], PETER W 10/29/2013, 9:31 AM

## 2013-10-29 NOTE — H&P (Signed)
  The recent History & Physical has been reviewed. I have personally examined the patient today. There is no interval change to the documented History & Physical. The patient would like to proceed with the procedure.  Norlene Campbell W 10/29/2013,  7:16 AM

## 2013-10-29 NOTE — Transfer of Care (Signed)
Immediate Anesthesia Transfer of Care Note  Patient: Rebecca Huff  Procedure(s) Performed: Procedure(s): LEFT TOTAL KNEE ARTHROPLASTY (Left)  Patient Location: PACU  Anesthesia Type:General  Level of Consciousness: awake, alert  and oriented  Airway & Oxygen Therapy: Patient Spontanous Breathing and Patient connected to nasal cannula oxygen  Post-op Assessment: Report given to PACU RN, Post -op Vital signs reviewed and stable and Patient moving all extremities X 4  Post vital signs: Reviewed and stable  Complications: No apparent anesthesia complications

## 2013-10-30 ENCOUNTER — Encounter (HOSPITAL_COMMUNITY): Payer: Self-pay | Admitting: General Practice

## 2013-10-30 DIAGNOSIS — M1712 Unilateral primary osteoarthritis, left knee: Secondary | ICD-10-CM | POA: Diagnosis present

## 2013-10-30 LAB — CBC
HCT: 32.1 % — ABNORMAL LOW (ref 36.0–46.0)
Hemoglobin: 10.9 g/dL — ABNORMAL LOW (ref 12.0–15.0)
MCV: 92.8 fL (ref 78.0–100.0)
Platelets: 206 10*3/uL (ref 150–400)
RBC: 3.46 MIL/uL — ABNORMAL LOW (ref 3.87–5.11)
RDW: 13 % (ref 11.5–15.5)
WBC: 12.5 10*3/uL — ABNORMAL HIGH (ref 4.0–10.5)

## 2013-10-30 LAB — BASIC METABOLIC PANEL
CO2: 25 mEq/L (ref 19–32)
Chloride: 107 mEq/L (ref 96–112)
GFR calc Af Amer: >90 mL/min (ref >90)
Potassium: 4.2 mEq/L (ref 3.5–5.1)

## 2013-10-30 NOTE — Op Note (Signed)
Rebecca Huff, CID NO.:  [id number]  MEDICAL RECORD NO.:  [id number]  LOCATION:  5N11C                        FACILITY:  [facility]  PHYSICIAN:  Claude Manges. Racquel Arkin, M.D.DATE OF BIRTH:  05-24-1956  DATE OF PROCEDURE:  10/29/2013 DATE OF DISCHARGE:                              OPERATIVE REPORT   PREOPERATIVE DIAGNOSIS:  End-stage osteoarthritis, left knee.  POSTOPERATIVE DIAGNOSIS:  End-stage osteoarthritis, left knee.  PROCEDURE:  Left total knee replacement.  SURGEON:  Claude Manges. [name], MD  ASSISTANT:  Oris Drone. Petrarca, PA-C, was present throughout the operative procedure to ensure its timely completion.  ANESTHESIA:  Regional femoral nerve block and general endotracheal.  COMPLICATIONS:  None.  COMPONENTS:  DePuy LCS standard femoral component, a #3 rotating tibial tray, a 12.5-mm polyethylene bridging bearing, a 3 PEG metal backed patella.  Components were secured with polymethyl methacrylate without antibiotics.  DESCRIPTION OF PROCEDURE:  Ms. Merilynn Finland was met in the holding area, identified the left knee as the appropriate operative site and marked accordingly.  She did receive a preoperative femoral nerve block for anesthesia.  Any questions were answered.  The patient was then transported to room #7 and placed under general endotracheal anesthesia without difficulty.  The nursing staff inserted a Foley catheter, urine was clear.  The left thigh was then placed in a thigh tourniquet, and the leg was prepped with chlorhexidine scrub and then DuraPrep from the tourniquet to the tips of the toes.  Sterile draping was performed.  Time-out was called.  The extremity elevated, was Esmarch exsanguinated with a proximal tourniquet at 350 mmHg.  A midline longitudinal incision was made, centered about the patella, extending from the superior pouch to tibial tubercle.  Via sharp dissection, incision was carried down to the subcutaneous tissue.   First layer of capsule was incised in the midline.  A medial parapatellar incision was then made through the deep capsule with the Bovie.  The knee joint was entered.  There was a small clear yellow joint effusion. I could easily evert the patella 180 degrees and flex the knee 90 degrees.  There were large osteophytes identified along the medial and lateral femoral condyle.  Complete absence of articular cartilage on the medial femoral condyle, medial tibial plateau with osteophytes along the medial tibial plateau as well.  There was a moderate amount of synovitis.  A synovectomy was performed.  The osteophytes were removed for sizing purposes.  I measured a standard femoral component.  First, bony cut was then made transversely along the proximal tibia with a 7-degree angle of declination.  Subsequent cuts were then made on the femur using the standard cutting guide.  Flexion and extension gaps were symmetrical 12.5 mm.  Laminar spreaders were then inserted in the medial and lateral compartment, removed medial and lateral menisci as well as ACL and PCL. There was a large sesamoid identified within the lateral compartment posteriorly, this was removed.  3/4-inch curved osteotome was used to remove any osteophytes from the posterior femoral condyle medially and laterally.  Finishing guide was then applied to obtain the tapering osteotomies and to obtain the center holes.  Again, flexion and extension gaps were symmetrical at 12.5 mm.  I did use a 4-degree distal femoral valgus cut. MCL and LCL remained intact throughout the procedure.  Retractors were then placed about the tibia, advancing anteriorly and measured a #3 tibial tray.  This was pinned in place.  Center hole was made followed by the keel cut.  With the trial tibial tray in place, a 12.5 mm bridging bearing was then inserted followed by the standard femoral component.  This was reduced and through a full range of  motion, we had perfect stability with varus or valgus testing, and full extension and flexion beyond 120 degrees without instability.  Patella was prepared by removing 10 mm of patella thickness, leaving 14 mm of patella.  Three holes were then made.  The trial patella was inserted and reduced and through a full range of motion, it remained stable.  The trial components were removed.  Joint was copiously irrigated with saline solution.  The final components were then impacted with polymethyl methacrylate. We initially inserted a #3 tibial tray followed by the 12.5-mm polyethylene bridging bearing and the standard femoral component.  The knee was then placed in full extension.  Extraneous methacrylate was removed from the periphery of the components.  The final patella was then impacted with polymethyl methacrylate and applied with a bone clamp, compressed with a patellar clamp.  After approximately 16 minutes, the methacrylate had matured, during which time we injected the deep capsule with 0.25% Marcaine with epinephrine and removed any further obvious synovitis.  The joint was then again inspected.  There was no evidence of loose material.  Tourniquet was deflated at 84 minutes.  Any bleeding was controlled with a Bovie. Medium-sized Hemovac was inserted through the lateral compartment.  The wound was again irrigated as it was throughout the operative procedure with sterile saline.  Deep capsule was then closed with a combination of 0 and running #1 Ethibond.  Superficial capsule was closed with a running 0 Vicryl, subcu with Vicryl and 3-0 Monocryl.  Skin closed with skin clips.  Sterile bulky dressing was applied followed by the patient's support stocking.  The patient tolerated the procedure without complications.    Claude Manges. [name], M.D.    PWW/MEDQ  D:  10/29/2013  T:  10/30/2013  Job:  [id number]

## 2013-10-30 NOTE — Progress Notes (Signed)
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   PT Comments   Good progress with amb distance; needs continued work on knee extension control  Follow Up Recommendations  Home health PT;Supervision/Assistance - 24 hour     Does the patient have the potential to tolerate intense rehabilitation     Barriers to Discharge        Equipment Recommendations  3in1 (PT)    Recommendations for Other Services OT consult  Frequency 7X/week   Progress towards PT Goals Progress towards PT goals: Progressing toward goals  Plan Current plan remains appropriate    Precautions / Restrictions Precautions Precautions: Knee Restrictions Weight Bearing Restrictions: Yes LLE Weight Bearing: Partial weight bearing LLE Partial Weight Bearing Percentage or Pounds: 50   Pertinent Vitals/Pain 6/10 L knee patient repositioned for comfort and optimal knee ext    Mobility  Bed Mobility Bed Mobility: Supine to Sit;Sitting - Scoot to Edge of Bed Supine to Sit: 4: Min assist Sitting - Scoot to [place] of Bed: 4: Min assist Details for Bed Mobility Assistance: Cues for technique, and physical assist to help LLE off of bed Transfers Transfers: Sit to Stand;Stand to Sit Sit to Stand: 4: Min assist Stand to Sit: 4: Min assist Details for Transfer Assistance: Cues for safety and hand placement Ambulation/Gait Ambulation/Gait Assistance: 4: Min assist Ambulation Distance (Feet): 50 Feet Assistive device: Rolling walker Ambulation/Gait Assistance Details: Cues for full extension of L knee in stance, and for RW positioning relative to steps Gait Pattern: Step-to pattern General Gait Details: slightly emerging step-through pattern    Exercises     PT Diagnosis:    PT Problem List:   PT Treatment  Interventions:     PT Goals (current goals can now be found in the care plan section) Acute Rehab PT Goals Patient Stated Goal: get back to doing whatever she wants PT Goal Formulation: With patient Time For Goal Achievement: 11/05/13 Potential to Achieve Goals: Good  Visit Information  Last PT Received On: 10/30/13 Assistance Needed: +1 History of Present Illness: S/p LTKA; 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    Subjective Data  Subjective: Painful today Patient Stated Goal: get back to doing whatever she wants   Cognition  Cognition Arousal/Alertness: Awake/alert Behavior During Therapy: WFL for tasks assessed/performed Overall Cognitive Status: Within Functional Limits for tasks assessed    Balance     End of Session PT - End of Session Equipment Utilized During Treatment: Gait belt Activity Tolerance: Patient tolerated treatment well Patient left: in chair;with call bell/phone within reach;with family/visitor present Nurse Communication: Mobility status CPM Left Knee CPM Left Knee: Off   GP     Rebecca Huff, [place] [phone]  10/30/2013, 4:40 PM

## 2013-10-30 NOTE — Progress Notes (Signed)
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   PT Comments   Making progress with activity tolerance; Need to focus on getting active knee extension open and closed chain  Follow Up Recommendations  Home health PT;Supervision/Assistance - 24 hour     Does the patient have the potential to tolerate intense rehabilitation     Barriers to Discharge        Equipment Recommendations  3in1 (PT)    Recommendations for Other Services OT consult  Frequency 7X/week   Progress towards PT Goals Progress towards PT goals: Progressing toward goals  Plan Current plan remains appropriate    Precautions / Restrictions Precautions Precautions: Knee Restrictions Weight Bearing Restrictions: Yes LLE Weight Bearing: Partial weight bearing LLE Partial Weight Bearing Percentage or Pounds: 50   Pertinent Vitals/Pain 7/10 L knee; patient repositioned for comfort and optimal knee ext    Mobility  Bed Mobility Bed Mobility: Supine to Sit;Sitting - Scoot to Edge of Bed Supine to Sit: 4: Min assist Sitting - Scoot to [place] of Bed: 4: Min assist Details for Bed Mobility Assistance: Cues for technique, and physical assist to help LLE off of bed Transfers Transfers: Sit to Stand;Stand to Sit Sit to Stand: 4: Min assist Stand to Sit: 4: Min assist Details for Transfer Assistance: Cues for safety and hand placement Ambulation/Gait Ambulation/Gait Assistance: 4: Min assist Ambulation Distance (Feet): 40 Feet Assistive device: Rolling walker Ambulation/Gait Assistance Details: Cues for gait sequence, RW position relative to step length; cues also for increasing heel strike and for L knee extesnion in stance; Tactile cueing for hip extension Gait Pattern: Step-to pattern    Exercises Total  Joint Exercises Ankle Circles/Pumps: AROM;Both;20 reps Quad Sets: AROM;Left;10 reps Short Arc QuadBarbaraann Boys;Left;10 reps Heel Slides: AAROM;AROM;Left;10 reps Straight Leg Raises: AAROM;Left;10 reps   PT Diagnosis:    PT Problem List:   PT Treatment Interventions:     PT Goals (current goals can now be found in the care plan section) Acute Rehab PT Goals Patient Stated Goal: get back to doing whatever she wants PT Goal Formulation: With patient Time For Goal Achievement: 11/05/13 Potential to Achieve Goals: Good  Visit Information  Last PT Received On: 10/30/13 Assistance Needed: +1 History of Present Illness: S/p LTKA; 50% PWB    Subjective Data  Subjective: Painful today Patient Stated Goal: get back to doing whatever she wants   Cognition  Cognition Arousal/Alertness: Awake/alert Behavior During Therapy: WFL for tasks assessed/performed Overall Cognitive Status: Within Functional Limits for tasks assessed    Balance     End of Session PT - End of Session Equipment Utilized During Treatment: Gait belt Activity Tolerance: Patient tolerated treatment well Patient left: in chair;with call bell/phone within reach;with family/visitor present Nurse Communication: Mobility status CPM Left Knee CPM Left Knee: Off   GP     Van Clines [place], [place] [phone]  10/30/2013, 12:26 PM

## 2013-10-30 NOTE — Evaluation (Signed)
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 She has DME & a/e and should benefit from 24 supervision w/ PRN assist.    OT Assessment  Patient needs continued OT Services    Follow Up Recommendations  24 supervision assist   Barriers to Discharge      Equipment Recommendations  Other (comment) (Pt has 3:1 and a/e per her report)    Recommendations for Other Services    Frequency  Min 2X/week    Precautions / Restrictions Precautions Precautions: Knee Restrictions Weight Bearing Restrictions: Yes LLE Weight Bearing: Partial weight bearing LLE Partial Weight Bearing Percentage or Pounds: 50%   Pertinent Vitals/Pain L knee pain, not rated. RN notified/made aware and pt repositioned and resting in chair.    ADL  Eating/Feeding: Performed;Independent Where Assessed - Eating/Feeding: Chair Grooming: Performed;Wash/dry face;Wash/dry hands;Supervision/safety Where Assessed - Grooming: Supported standing;Unsupported standing Upper Body Bathing: Simulated;Set up Where Assessed - Upper Body Bathing: Supported sitting;Unsupported sitting Lower Body Bathing: Simulated;Moderate assistance Where Assessed - Lower Body Bathing: Supported sit to stand Upper Body Dressing: Simulated;Set up Where Assessed - Upper Body Dressing: Unsupported sitting Lower Body Dressing: Simulated;Moderate assistance Where Assessed - Lower Body Dressing: Supported sit to stand Toilet Transfer: Performed;Min Armed forces training and education officer Method: Sit to Barista: Raised toilet seat with arms (or 3-in-1 over toilet) Toileting - Clothing Manipulation and Hygiene: Performed;Supervision/safety Where Assessed - Toileting Clothing Manipulation and Hygiene: Standing;Sit to stand from 3-in-1 or toilet Tub/Shower Transfer Method: Not assessed Equipment Used: Gait belt;Rolling walker Transfers/Ambulation Related to ADLs: Pt is overall min guard-supervision level for transfers and functional mobility related to ADL's using RW & LLE PWB. ADL Comments: Pt was educated in role of OT and verbal discussion of POC/goals, as well as a/e for LB ADL's. Pt also participated in grooming while standing at sink and toileting transfers. At conclusion of session, pt reported that one of her daughters is a Engineer, structural. She will have family assist PRN at d/c.    OT Diagnosis: Acute pain  OT Problem List: Decreased activity tolerance;Decreased knowledge of precautions;Decreased knowledge of use of DME or AE;Pain OT Treatment Interventions: Self-care/ADL training;DME and/or AE instruction;Patient/family education;Therapeutic activities   OT Goals(Current goals can be found in the care plan section) Acute Rehab OT Goals Patient Stated Goal: Home with family assist PRN OT Goal Formulation: With patient Time For Goal Achievement: 11/13/13 Potential to Achieve Goals: Good  Visit Information  Last OT Received On: 10/30/13 Assistance Needed: +1 History of Present Illness: S/p LTKA; 50% PWB       Prior Functioning     Home Living Family/patient expects to be discharged to:: Private residence Living Arrangements: Children Available Help at Discharge: Family;Available 24 hours/day Type of Home: Apartment Home Access: Stairs to enter Entrance Stairs-Number of Steps: 1 Entrance Stairs-Rails: None Home Layout: One level Home Equipment: Walker - 2 wheels;Tub bench;Bedside commode Prior Function Level of Independence:  Independent Communication Communication: No difficulties Dominant Hand: Right    Vision/Perception Vision - History Baseline Vision: Wears contacts Patient Visual  Report: No change from baseline   Cognition  Cognition Arousal/Alertness: Awake/alert Behavior During Therapy: WFL for tasks assessed/performed Overall Cognitive Status: Within Functional Limits for tasks assessed    Extremity/Trunk Assessment Upper Extremity Assessment Upper Extremity Assessment: Overall WFL for tasks assessed Lower Extremity Assessment Lower Extremity Assessment: Defer to PT evaluation    Mobility Bed Mobility Bed Mobility: Not assessed (Pt up in chair) Supine to Sit: 4: Min assist Sitting - Scoot to Edge of Bed: 4: Min assist Details for Bed Mobility Assistance: Cues for technique, and physical assist to help LLE off of bed Transfers Transfers: Sit to Stand;Stand to Sit Sit to Stand: 4: Min guard;From chair/3-in-1;With upper extremity assist;With armrests Stand to Sit: 4: Min guard;To chair/3-in-1;With armrests Details for Transfer Assistance: Cues for safety and hand placement           End of Session OT - End of Session Equipment Utilized During Treatment: Gait belt;Rolling walker;Other (comment) (3:1 over toilet in bathroom) Activity Tolerance: Patient tolerated treatment well Patient left: in chair;with call bell/phone within reach Nurse Communication: Patient requests pain meds;Other (comment) (For bed to made up/new sheets) CPM Left Knee CPM Left Knee: Off  GO     Alm Bustard 10/30/2013, 12:59 PM

## 2013-10-30 NOTE — Progress Notes (Signed)
Patient ID: Rebecca Huff, female   DOB: Oct 03, 1956, 57 y.o.   MRN: [id number] PATIENT ID: Rebecca Huff        MRN:  [id number]          DOB/AGE: [birth year] / 57 y.o.    Norlene Campbell, MD   Jacqualine Code, PA-C [address] [place], [place]  [address]                             [phone]   PROGRESS NOTE  Subjective:  negative for Chest Pain  negative for Shortness of Breath  negative for Nausea/Vomiting   negative for Calf Pain    Tolerating Diet: yes         Patient reports pain as moderate.     Pain started about 11 last night-OK with analgesics  Objective: Vital signs in last 24 hours:   Patient Vitals for the past 24 hrs:  BP Temp Temp src Pulse Resp SpO2  10/30/13 0431 99/58 mmHg 97.7 F (36.5 C) - 71 18 97 %  10/30/13 0400 - - - - 18 97 %  10/30/13 0000 - - - - 18 94 %  10/29/13 2358 98/62 mmHg 99.4 F (37.4 C) - 84 18 94 %  10/29/13 2125 104/91 mmHg 99.3 F (37.4 C) Oral 94 17 100 %  10/29/13 2000 - - - - 18 94 %  10/29/13 1600 - - - - 16 100 %  10/29/13 1516 123/77 mmHg 99 F (37.2 C) Oral 91 18 95 %  10/29/13 1200 132/80 mmHg 98.8 F (37.1 C) Oral 79 18 100 %  10/29/13 1149 139/88 mmHg 97 F (36.1 C) Oral 80 18 98 %  10/29/13 1128 - - - - 15 98 %  10/29/13 1121 144/86 mmHg 97 F (36.1 C) - 80 8 98 %  10/29/13 1120 - - - 80 11 98 %  10/29/13 1115 - - - 81 7 99 %  10/29/13 1110 139/85 mmHg - - 82 12 97 %  10/29/13 1100 - - - 86 15 99 %  10/29/13 1045 - - - 78 15 98 %  10/29/13 1040 150/89 mmHg - - 86 14 98 %  10/29/13 1030 - - - 83 16 98 %  10/29/13 1025 152/88 mmHg - - 82 16 96 %  10/29/13 1015 - - - 81 16 96 %  10/29/13 1010 161/87 mmHg 97.6 F (36.4 C) - 83 18 98 %      Intake/Output from previous day:   12/02 0701 - 12/03 0700 In: 2200 [P.O.:750; I.V.:1450] Out: 1870 [Urine:1420; Drains:450]   Intake/Output this shift:       Intake/Output     12/02 0701 - 12/03 0700 12/03 0701 - 12/04 0700   P.O. 750    I.V. 1450    Total  Intake 2200     Urine 1420    Drains 450    Total Output 1870     Net +330             LABORATORY DATA:  Recent Labs  10/30/13 0452  WBC 12.5*  HGB 10.9*  HCT 32.1*  PLT 206    Recent Labs  10/30/13 0452  NA 140  K 4.2  CL 107  CO2 25  BUN 11  CREATININE 0.63  GLUCOSE 124*  CALCIUM 8.6   Lab Results  Component Value Date   INR 0.91 10/18/2013    Recent Radiographic  Studies :  Chest 2 View  10/18/2013   CLINICAL DATA:  Status post left knee arthroplasty  EXAM: CHEST  2 VIEW  COMPARISON:  None.  FINDINGS: The heart size and mediastinal contours are within normal limits. Both lungs are clear. The visualized skeletal structures are unremarkable.  IMPRESSION: No active cardiopulmonary disease.   Electronically Signed   By: Salome Holmes M.D.   On: 10/18/2013 11:24     Examination:  General appearance: alert, cooperative and no distress  Wound Exam: clean, dry, intact   Drainage:  Moderate amount Serosanguinous exudate in hemovac-100 cc's since midnight  Motor Exam: EHL, FHL, Anterior Tibial and Posterior Tibial Intact  Sensory Exam: Superficial Peroneal, Deep Peroneal and Tibial normal  Vascular Exam: Normal  Assessment:    1 Day Post-Op  Procedure(s) (LRB): LEFT TOTAL KNEE ARTHROPLASTY (Left)  ADDITIONAL DIAGNOSIS:  Active Problems:   S/P total knee replacement using cement  no new problems   Plan: Physical Therapy as ordered Partial Weight Bearing @ 50% (PWB)  DVT Prophylaxis:  Xarelto  DISCHARGE PLAN: Home  DISCHARGE NEEDS: HHPT, CPM, Walker and 3-in-1 comode seat   foley out,hemovac out tomorrow, OOB with PT      Valeria Batman 10/30/2013 7:39 AM

## 2013-10-31 LAB — BASIC METABOLIC PANEL
BUN: 10 mg/dL (ref 6–23)
CO2: 24 mEq/L (ref 19–32)
Chloride: 104 mEq/L (ref 96–112)
Creatinine, Ser: 0.71 mg/dL (ref 0.50–1.10)
GFR calc Af Amer: >90 mL/min (ref >90)
Glucose, Bld: 109 mg/dL — ABNORMAL HIGH (ref 70–99)
Potassium: 4.1 mEq/L (ref 3.5–5.1)
Sodium: 139 mEq/L (ref 135–145)

## 2013-10-31 LAB — CBC
HCT: 29.4 % — ABNORMAL LOW (ref 36.0–46.0)
Hemoglobin: 10.2 g/dL — ABNORMAL LOW (ref 12.0–15.0)
MCV: 91.9 fL (ref 78.0–100.0)
RDW: 12.9 % (ref 11.5–15.5)
WBC: 11.5 10*3/uL — ABNORMAL HIGH (ref 4.0–10.5)

## 2013-10-31 MED ORDER — METHOCARBAMOL 500 MG PO TABS: 500.0000 mg | ORAL_TABLET | Freq: Three times a day (TID) | ORAL | Status: DC | PRN

## 2013-10-31 MED ORDER — OXYCODONE HCL 5 MG PO TABS: 5.0000 mg | ORAL_TABLET | ORAL | Status: DC | PRN

## 2013-10-31 MED ORDER — RIVAROXABAN 10 MG PO TABS: 10.0000 mg | ORAL_TABLET | Freq: Every day | ORAL | Status: DC

## 2013-10-31 NOTE — Progress Notes (Signed)
Rebecca Huff to be D/C'd Home per MD order. Discussed with the patient and all questions fully answered.    Medication List    STOP taking these medications       diclofenac 75 MG EC tablet  Commonly known as:  VOLTAREN     traMADol 50 MG tablet  Commonly known as:  ULTRAM      TAKE these medications       acetaminophen 500 MG tablet  Commonly known as:  TYLENOL  Take 1,000 mg by mouth every 6 (six) hours as needed for mild pain.     CALCIUM PO  Take 1 tablet by mouth daily.     LORazepam 0.5 MG tablet  Commonly known as:  ATIVAN  Take 0.75 mg by mouth at bedtime as needed for sleep.     methocarbamol 500 MG tablet  Commonly known as:  ROBAXIN  Take 1 tablet (500 mg total) by mouth every 8 (eight) hours as needed for muscle spasms.     nicotine 10 MG inhaler  Commonly known as:  NICOTROL  Inhale 1 continuous puffing into the lungs as needed for smoking cessation.     oxyCODONE 5 MG immediate release tablet  Commonly known as:  Oxy IR/ROXICODONE  Take 1-2 tablets (5-10 mg total) by mouth every 4 (four) hours as needed for breakthrough pain.     rivaroxaban 10 MG Tabs tablet  Commonly known as:  XARELTO  Take 1 tablet (10 mg total) by mouth at bedtime.     sertraline 50 MG tablet  Commonly known as:  ZOLOFT  Take 50 mg by mouth daily.        VVS, Skin clean, dry and intact without evidence of skin break down, no evidence of skin tears noted.  IV catheter discontinued intact. Site without signs and symptoms of complications. Dressing and pressure applied.  An After Visit Summary was printed and given to the patient.  Patient escorted via WC, and D/C home via private auto.  Kai Levins  10/31/2013 5:59 PM

## 2013-10-31 NOTE — Progress Notes (Signed)
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  Ambulated ~60' with RW however required 2 seated rest breaks to complete due to pain.  Pt able to complete stair training.  Although pain worse today, this therapist feels pt safe to d/c home from mobility standpoint when MD feels medically ready.     Follow Up Recommendations  Home health PT;Supervision/Assistance - 24 hour     Does the patient have the potential to tolerate intense rehabilitation     Barriers to Discharge        Equipment Recommendations  3in1 (PT)    Recommendations for Other Services    Frequency 7X/week   Progress towards PT Goals Progress towards PT goals: Progressing toward goals  Plan Current plan remains appropriate    Precautions / Restrictions Precautions Precautions: Knee Restrictions Weight Bearing Restrictions: Yes LLE Weight Bearing: Partial weight bearing LLE Partial Weight Bearing Percentage or Pounds: 50   Pertinent Vitals/Pain 8/10 Lt knee.  RN notified for pain medication.       Mobility  Bed Mobility Bed Mobility: Supine to Sit;Sitting - Scoot to Edge of Bed Supine to Sit: 5: Supervision;HOB flat Sitting - Scoot to Edge of Bed: 5: Supervision Details for Bed Mobility Assistance: Incr time but no physical (A) needed.  Transfers Transfers: Sit to Stand;Stand to Sit Sit to Stand: 5: Supervision;With upper extremity assist;With armrests;From bed;From chair/3-in-1 Stand to Sit: 5: Supervision;With upper extremity assist;With armrests;To chair/3-in-1 Details for Transfer Assistance: Pt demonstrating good hand placement and adherence to LLE PWB, moves slowly due to pain (was premedicated per  pt/family). Ambulation/Gait Ambulation/Gait Assistance: 4: Min guard Ambulation Distance (Feet): 60 Feet Assistive device: Rolling walker Ambulation/Gait Assistance Details: Pt cont's to lack terminal knee extension during stance phase LLE.  Required 2 seated rest breaks to complete distance due to pain Gait Pattern: Step-to pattern;Antalgic;Left flexed knee in stance Gait velocity: decreased General Gait Details: Slow due to pain but steady.   Stairs: Yes Stairs Assistance: 4: Min guard Stair Management Technique: No rails;Backwards;With walker Number of Stairs: 1 Wheelchair Mobility Wheelchair Mobility: No    Exercises Total Joint Exercises Ankle Circles/Pumps: AROM;Both;10 reps Quad Sets: AROM;Strengthening;Both;10 reps     PT Goals (current goals can now be found in the care plan section) Acute Rehab PT Goals Patient Stated Goal: Home later today w/ family assist PT Goal Formulation: With patient Time For Goal Achievement: 11/05/13 Potential to Achieve Goals: Good  Visit Information  Last PT Received On: 10/31/13 Assistance Needed: +1 History of Present Illness: S/p LTKA; 50% PWB    Subjective Data  Patient Stated Goal: Home later today w/ family assist   Cognition  Cognition Arousal/Alertness: Awake/alert Behavior During Therapy: WFL for tasks assessed/performed Overall Cognitive Status: Within Functional Limits for tasks assessed    Balance  Balance Balance Assessed: Yes  End of Session PT - End of Session Equipment Utilized During Treatment: Gait belt Activity Tolerance: Patient tolerated treatment well;Patient limited by pain Patient left: Other (comment);with family/visitor present (with OT) Nurse Communication: Mobility status CPM Left Knee CPM Left Knee: Off   GP     Lara Mulch 10/31/2013, 11:41 AM   Verdell Face, PTA [phone] 10/31/2013

## 2013-10-31 NOTE — Discharge Summary (Signed)
Norlene Campbell, MD   Jacqualine Code, PA-C [address], [place], [place]  [address]                             [phone]  PATIENT ID: Rebecca Huff        MRN:  [id number]          DOB/AGE: [birth year] / 57 y.o.    DISCHARGE SUMMARY  ADMISSION DATE:    10/29/2013 DISCHARGE DATE:   10/31/2013   ADMISSION DIAGNOSIS: LEFT KNEE OSTEOARTHRITIS    DISCHARGE DIAGNOSIS:  LEFT KNEE OSTEOARTHRITIS    ADDITIONAL DIAGNOSIS: Principal Problem:   Osteoarthritis of left knee Active Problems:   S/P total knee replacement using cement  Past Medical History  Diagnosis Date  . Arthritis   . Anxiety     PROCEDURE: Procedure(s): LEFT TOTAL KNEE ARTHROPLASTY  on 10/29/2013  CONSULTS: none     HISTORY: Rebecca Huff is a very pleasant 57 year old white female who is seen today for evaluation of her left knee. She has undergone a previous arthroscopy in December of 2013 with a medial and lateral partial meniscectomy, but she had a pretty significant osteoarthritis at that time more medial than lateral. Initially she did well. She also used Voltaren gel which was also beneficial, but after continued use it had become ineffective. She has had cortisone injections performed and also that of viscosupplementation. She has even tried Humira and Enbrel in the past. She has undergone multiple treatments and multiple dosage of the viscosupplementation and cortisone. Most recently though she is refractory to almost any type of treatment. She is not having pain with every step. Nighttime pain keeps her wake. She also has pain with activities of daily living. She has also noted that she does not have quite as much range of motion. She is not having swelling. Her pain is moderately severe, has more of an aching, throbbing pain with occasional catching of the knee.   HOSPITAL COURSE:  Rebecca Huff is a 57 y.o. admitted on 10/29/2013 and found to have a diagnosis of LEFT KNEE OSTEOARTHRITIS.  After appropriate  laboratory studies were obtained  they were taken to the operating room on 10/29/2013 and underwent  Procedure(s): LEFT TOTAL KNEE ARTHROPLASTY  .   They were given perioperative antibiotics:  Anti-infectives   Start     Dose/Rate Route Frequency Ordered Stop   10/29/13 1400  ceFAZolin (ANCEF) IVPB 2 g/50 mL premix     2 g 100 mL/hr over 30 Minutes Intravenous Every 6 hours 10/29/13 1200 10/29/13 2215   10/29/13 0600  ceFAZolin (ANCEF) IVPB 2 g/50 mL premix     2 g 100 mL/hr over 30 Minutes Intravenous On call to O.R. 10/28/13 1542 10/29/13 0740    .  Tolerated the procedure well.  Placed with a foley intraoperatively.    Toradol was given post op.  POD #1, allowed out of bed to a chair.  PT for ambulation and exercise program.  Foley D/C'd in morning.  IV saline locked.  O2 discontionued.  POD #2, continued PT and ambulation.  Hemovac pulled. . The remainder of the hospital course was dedicated to ambulation and strengthening.   The patient was discharged on 2 Days Post-Op in  Stable condition.  Blood products given:none  DIAGNOSTIC STUDIES: Recent vital signs: Patient Vitals for the past 24 hrs:  BP Temp Temp src Pulse Resp SpO2  10/31/13 1400 135/66 mmHg 99.2 F (37.3  C) - 89 17 96 %  10/31/13 0745 - 98.7 F (37.1 C) Oral - - -  10/31/13 0657 - 101.1 F (38.4 C) Oral - - -  10/31/13 0510 100/58 mmHg 100 F (37.8 C) Oral 89 18 99 %  10/30/13 2230 - 99.4 F (37.4 C) - - - -  10/30/13 2043 112/61 mmHg 100.8 F (38.2 C) Oral 93 18 98 %       Recent laboratory studies:  Recent Labs  10/30/13 0452 10/31/13 0541  WBC 12.5* 11.5*  HGB 10.9* 10.2*  HCT 32.1* 29.4*  PLT 206 189    Recent Labs  10/30/13 0452 10/31/13 0541  NA 140 139  K 4.2 4.1  CL 107 104  CO2 25 24  BUN 11 10  CREATININE 0.63 0.71  GLUCOSE 124* 109*  CALCIUM 8.6 8.5   Lab Results  Component Value Date   INR 0.91 10/18/2013     Recent Radiographic Studies :  Chest 2 View  10/18/2013    CLINICAL DATA:  Status post left knee arthroplasty  EXAM: CHEST  2 VIEW  COMPARISON:  None.  FINDINGS: The heart size and mediastinal contours are within normal limits. Both lungs are clear. The visualized skeletal structures are unremarkable.  IMPRESSION: No active cardiopulmonary disease.   Electronically Signed   By: Salome Holmes M.D.   On: 10/18/2013 11:24    DISCHARGE INSTRUCTIONS: Discharge Orders   Future Orders Complete By Expires   Call MD / Call 911  As directed    Comments:     If you experience chest pain or shortness of breath, CALL 911 and be transported to the hospital emergency room.  If you develope a fever above 101 F, pus (white drainage) or increased drainage or redness at the wound, or calf pain, call your surgeon's office.   Change dressing  As directed    Comments:     Change dressing on SUNDAY, then change the dressing daily with sterile 4 x 4 inch gauze dressing and apply TED hose.  You may clean the incision with alcohol prior to redressing.   Constipation Prevention  As directed    Comments:     Drink plenty of fluids.  Prune juice and/or coffee may be helpful.  You may use a stool softener, such as Colace (over the counter) 100 mg twice a day.  Use MiraLax (over the counter) for constipation as needed but this may take several days to work.  Mag Citrate --OR-- Milk of Magnesia --OR -- Dulcolax pills/suppositories may also be used but follow directions on the label.   CPM  As directed    Comments:     Continuous passive motion machine (CPM):      Use the CPM from 0 to 60 for 6-8 hours per day.      You may increase by 5-10 per day.  You may break it up into 2 or 3 sessions per day.      Use CPM for 3-4 weeks or until you are told to stop.   Diet general  As directed    Discharge instructions  As directed    Comments:     YOU WERE GIVEN A DEVICE CALLED AN INCENTIVE SPIROMETER TO HELP YOU TAKE DEEP BREATHS.  PLEASE USE THIS AT LEAST TEN (10) TIMES EVERY 1-2 HOURS  EVERY DAY TO PREVENT PNEUMONIA.   Do not put a pillow under the knee. Place it under the heel.  As directed  Driving restrictions  As directed    Comments:     No driving for 6 weeks   Increase activity slowly as tolerated  As directed    Lifting restrictions  As directed    Comments:     No lifting for 6 weeks   Partial weight bearing  As directed    Comments:     50 % WEIGHT BEARING AS TAUGHT IN PHYSICAL THERAPY   Questions:     % Body Weight:     Laterality:     Extremity:     Patient may shower  As directed    Comments:     You may shower over the brown dressing.  Once the dressing is removed you may shower without a dressing once there is no drainage.  Do not wash over the wound.  If drainage remains, cover wound with plastic wrap and then shower.   TED hose  As directed    Comments:     Use stockings (TED hose) for 2 weeks on operative leg(s).  You may remove them at night for sleeping.      DISCHARGE MEDICATIONS:     Medication List    STOP taking these medications       diclofenac 75 MG EC tablet  Commonly known as:  VOLTAREN     traMADol 50 MG tablet  Commonly known as:  ULTRAM      TAKE these medications       acetaminophen 500 MG tablet  Commonly known as:  TYLENOL  Take 1,000 mg by mouth every 6 (six) hours as needed for mild pain.     CALCIUM PO  Take 1 tablet by mouth daily.     LORazepam 0.5 MG tablet  Commonly known as:  ATIVAN  Take 0.75 mg by mouth at bedtime as needed for sleep.     methocarbamol 500 MG tablet  Commonly known as:  ROBAXIN  Take 1 tablet (500 mg total) by mouth every 8 (eight) hours as needed for muscle spasms.     nicotine 10 MG inhaler  Commonly known as:  NICOTROL  Inhale 1 continuous puffing into the lungs as needed for smoking cessation.     oxyCODONE 5 MG immediate release tablet  Commonly known as:  Oxy IR/ROXICODONE  Take 1-2 tablets (5-10 mg total) by mouth every 4 (four) hours as needed for breakthrough pain.       rivaroxaban 10 MG Tabs tablet  Commonly known as:  XARELTO  Take 1 tablet (10 mg total) by mouth at bedtime.     sertraline 50 MG tablet  Commonly known as:  ZOLOFT  Take 50 mg by mouth daily.        FOLLOW UP VISIT:       Follow-up Information   Follow up with PETRARCA,BRIAN, PA-C. Schedule an appointment as soon as possible for a visit on 11/13/2013.   Specialty:  Orthopedic Surgery   Contact information:   [address] [place] [address] [phone]       DISPOSITION:   Home  CONDITION:  Stable   PETRARCA,BRIAN 10/31/2013, 5:20 PM

## 2013-10-31 NOTE — Progress Notes (Signed)
Occupational Therapy Treatment Patient Details Name: Rebecca Huff MRN: [id number] DOB: 1956/11/27 Today's Date: 10/31/2013 Time: 4098-1191 OT Time Calculation (min): 21 min  OT Assessment / Plan / Recommendation  History of present illness S/p LTKA; 50% PWB   OT comments  Pt was educated in tub transfers today given Min A to LLE into/out of tub using tub bench, handouts issued for a/e for assist w/ LB bathing/dressing. Pt has a/e, 3:1 & tub bench at home. She plans to d/c home later today w/ family assist PRN. Progressing nicely toward POC/goals despite pain today.  Follow Up Recommendations  No OT follow up;Supervision/Assistance - 24 hour    Barriers to Discharge       Equipment Recommendations  None recommended by OT    Recommendations for Other Services    Frequency Min 2X/week   Progress towards OT Goals Progress towards OT goals: Progressing toward goals  Plan Discharge plan remains appropriate    Precautions / Restrictions Precautions Precautions: Knee Restrictions Weight Bearing Restrictions: Yes LLE Weight Bearing: Partial weight bearing LLE Partial Weight Bearing Percentage or Pounds: 50   Pertinent Vitals/Pain 5-6/10 L knee pain, pt was given medication prior to treatment session (RN aware), activity followed by rest, ice, repositioning.    ADL  Grooming: Performed;Wash/dry hands;Wash/dry face;Supervision/safety Where Assessed - Grooming: Supported standing;Unsupported standing Lower Body Dressing: Simulated;Minimal assistance Where Assessed - Lower Body Dressing: Supported sit to stand Toilet Transfer: Research scientist (life sciences) Method: Sit to Barista: Raised toilet seat with arms (or 3-in-1 over toilet) Toileting - Clothing Manipulation and Hygiene: Performed;Supervision/safety Where Assessed - Engineer, mining and Hygiene: Sit to stand from 3-in-1 or toilet Tub/Shower Transfer: Performed;Minimal  assistance Tub/Shower Transfer Method: Science writer: Counsellor Used: Gait belt;Rolling walker;Other (comment) (3:1, tub transfer bench) Transfers/Ambulation Related to ADLs: Pt overall supervision level chair/toilet transfers w/ RW, Min assist tub transfers using tub bench. ADL Comments: Pt was issued handouts today that were reviewed re: LB dressing/bathing w/ A/E, tub/shower transfers w/ tub bench. Pt was Min assist of L LE to get into/out of tub. VC's for safety, hand placement as well. Pt is limited today by pain, but moves well despite this. She plans to d/c home later today w/ family PRN assist.    OT Diagnosis:    OT Problem List:   OT Treatment Interventions:     OT Goals(current goals can now be found in the care plan section) Acute Rehab OT Goals Patient Stated Goal: Home later today w/ family assist OT Goal Formulation: With patient Time For Goal Achievement: 11/13/13 Potential to Achieve Goals: Good  Visit Information  Last OT Received On: 10/31/13 Assistance Needed: +1 History of Present Illness: S/p LTKA; 50% PWB    Subjective Data      Prior Functioning       Cognition  Cognition Arousal/Alertness: Awake/alert Behavior During Therapy: WFL for tasks assessed/performed Overall Cognitive Status: Within Functional Limits for tasks assessed    Mobility  Bed Mobility Bed Mobility: Not assessed (Pt in bathroom upon OT arrival) Transfers Transfers: Sit to Stand;Stand to Sit Sit to Stand: 5: Supervision;From chair/3-in-1;With armrests Stand to Sit: 5: Supervision;To chair/3-in-1;With armrests Details for Transfer Assistance: Pt demonstrating good hand placement and adherence to LLE PWB, moves slowly due to pain (was premedicated per pt/family).           End of Session OT - End of Session Equipment Utilized During Treatment: Rolling walker;Gait belt;Other (comment) (3:1, tub  bench) Activity Tolerance: Patient  tolerated treatment well;Patient limited by pain Patient left: in chair;with call bell/phone within reach;with family/visitor present  GO     Roselie Awkward Dixon 10/31/2013, 9:09 AM

## 2014-04-07 ENCOUNTER — Other Ambulatory Visit: Payer: Self-pay

## 2014-04-07 DIAGNOSIS — Z1231 Encounter for screening mammogram for malignant neoplasm of breast: Secondary | ICD-10-CM

## 2014-04-11 ENCOUNTER — Ambulatory Visit: Payer: BC Managed Care – PPO

## 2014-04-30 ENCOUNTER — Ambulatory Visit
Admission: RE | Admit: 2014-04-30 | Discharge: 2014-04-30 | Disposition: A | Discharge status: Home / Self Care | Payer: BC Managed Care – PPO | Source: Ambulatory Visit

## 2014-04-30 DIAGNOSIS — Z1231 Encounter for screening mammogram for malignant neoplasm of breast: Secondary | ICD-10-CM

## 2014-12-09 ENCOUNTER — Other Ambulatory Visit: Payer: Self-pay | Admitting: Orthopaedic Surgery

## 2014-12-09 DIAGNOSIS — M25561 Pain in right knee: Secondary | ICD-10-CM

## 2014-12-24 ENCOUNTER — Ambulatory Visit
Admission: RE | Admit: 2014-12-24 | Discharge: 2014-12-24 | Disposition: A | Discharge status: Home / Self Care | Payer: BLUE CROSS/BLUE SHIELD | Source: Ambulatory Visit | Attending: Orthopaedic Surgery | Admitting: Orthopaedic Surgery

## 2014-12-24 DIAGNOSIS — M25561 Pain in right knee: Secondary | ICD-10-CM

## 2015-04-09 ENCOUNTER — Encounter (HOSPITAL_COMMUNITY): Payer: Self-pay

## 2015-04-09 ENCOUNTER — Encounter (HOSPITAL_COMMUNITY)
Admission: RE | Admit: 2015-04-09 | Discharge: 2015-04-09 | Disposition: A | Discharge status: Home / Self Care | Payer: BLUE CROSS/BLUE SHIELD | Source: Ambulatory Visit | Attending: Orthopaedic Surgery | Admitting: Orthopaedic Surgery

## 2015-04-09 ENCOUNTER — Encounter (HOSPITAL_COMMUNITY)
Admission: RE | Admit: 2015-04-09 | Discharge: 2015-04-09 | Disposition: A | Discharge status: Home / Self Care | Payer: BLUE CROSS/BLUE SHIELD | Source: Ambulatory Visit | Attending: Orthopedic Surgery | Admitting: Orthopedic Surgery

## 2015-04-09 DIAGNOSIS — Z01812 Encounter for preprocedural laboratory examination: Secondary | ICD-10-CM | POA: Insufficient documentation

## 2015-04-09 DIAGNOSIS — F17210 Nicotine dependence, cigarettes, uncomplicated: Secondary | ICD-10-CM | POA: Insufficient documentation

## 2015-04-09 DIAGNOSIS — Z01810 Encounter for preprocedural cardiovascular examination: Secondary | ICD-10-CM | POA: Diagnosis not present

## 2015-04-09 DIAGNOSIS — Z01818 Encounter for other preprocedural examination: Secondary | ICD-10-CM | POA: Diagnosis present

## 2015-04-09 DIAGNOSIS — M1711 Unilateral primary osteoarthritis, right knee: Secondary | ICD-10-CM | POA: Diagnosis not present

## 2015-04-09 HISTORY — DX: Gastro-esophageal reflux disease without esophagitis: K21.9

## 2015-04-09 HISTORY — DX: Depression, unspecified: F32.A

## 2015-04-09 HISTORY — DX: Major depressive disorder, single episode, unspecified: F32.9

## 2015-04-09 HISTORY — DX: Malignant (primary) neoplasm, unspecified: C80.1

## 2015-04-09 LAB — COMPREHENSIVE METABOLIC PANEL
ALBUMIN: 4 g/dL (ref 3.5–5.0)
ALT: 19 U/L (ref 14–54)
ANION GAP: 8 (ref 5–15)
AST: 21 U/L (ref 15–41)
Alkaline Phosphatase: 74 U/L (ref 38–126)
BUN: 8 mg/dL (ref 6–20)
CALCIUM: 9.3 mg/dL (ref 8.9–10.3)
CO2: 30 mmol/L (ref 22–32)
Chloride: 106 mmol/L (ref 101–111)
Creatinine, Ser: 0.93 mg/dL (ref 0.44–1.00)
GFR calc non Af Amer: >60 mL/min (ref >60)
Glucose, Bld: 76 mg/dL (ref 65–99)
Potassium: 3.7 mmol/L (ref 3.5–5.1)
Sodium: 144 mmol/L (ref 135–145)
Total Bilirubin: 0.5 mg/dL (ref 0.3–1.2)
Total Protein: 6.8 g/dL (ref 6.5–8.1)

## 2015-04-09 LAB — CBC WITH DIFFERENTIAL/PLATELET
BASOS ABS: 0.1 10*3/uL (ref 0.0–0.1)
BASOS PCT: 1 % (ref 0–1)
EOS PCT: 1 % (ref 0–5)
Eosinophils Absolute: 0.1 10*3/uL (ref 0.0–0.7)
HEMATOCRIT: 42.1 % (ref 36.0–46.0)
Hemoglobin: 14.9 g/dL (ref 12.0–15.0)
Lymphocytes Relative: 52 % — ABNORMAL HIGH (ref 12–46)
Lymphs Abs: 3.8 10*3/uL (ref 0.7–4.0)
MCH: 32.1 pg (ref 26.0–34.0)
MCHC: 35.4 g/dL (ref 30.0–36.0)
MCV: 90.7 fL (ref 78.0–100.0)
MONO ABS: 0.5 10*3/uL (ref 0.1–1.0)
Monocytes Relative: 7 % (ref 3–12)
Neutro Abs: 2.8 10*3/uL (ref 1.7–7.7)
Neutrophils Relative %: 39 % — ABNORMAL LOW (ref 43–77)
Platelets: 210 10*3/uL (ref 150–400)
RBC: 4.64 MIL/uL (ref 3.87–5.11)
RDW: 12.6 % (ref 11.5–15.5)
WBC: 7.3 10*3/uL (ref 4.0–10.5)

## 2015-04-09 LAB — URINALYSIS, ROUTINE W REFLEX MICROSCOPIC
Bilirubin Urine: NEGATIVE
GLUCOSE, UA: NEGATIVE mg/dL
Hgb urine dipstick: NEGATIVE
KETONES UR: NEGATIVE mg/dL
LEUKOCYTES UA: NEGATIVE
Nitrite: NEGATIVE
PROTEIN: NEGATIVE mg/dL
Specific Gravity, Urine: 1.007 (ref 1.005–1.030)
Urobilinogen, UA: 0.2 mg/dL (ref 0.0–1.0)
pH: 6 (ref 5.0–8.0)

## 2015-04-09 LAB — SURGICAL PCR SCREEN
MRSA, PCR: NEGATIVE
Staphylococcus aureus: NEGATIVE

## 2015-04-09 LAB — PROTIME-INR
INR: 0.97 (ref 0.00–1.49)
Prothrombin Time: 13 seconds (ref 11.6–15.2)

## 2015-04-09 LAB — APTT: aPTT: 29 seconds (ref 24–37)

## 2015-04-09 NOTE — Progress Notes (Signed)
Patient going on vacation to the beach x 1 week.  She prefers not to have the blue blood band on then and understands that she will have another blood draw the DOS.  DA

## 2015-04-09 NOTE — Pre-Procedure Instructions (Signed)
Rebecca Huff  04/09/2015   Your procedure is scheduled on:  Tuesday, May 24th   Report to [facility] Admitting at 5:30 AM.   Call this number if you have problems the morning of surgery: [phone]   Remember:   Do not eat food or drink liquids after midnight Monday.   Take these medicines the morning of surgery with A SIP OF WATER:  Zoloft             Please STOP taking Mobic and any other anti-inflammatories 4-5 prior to surgery.   Do not wear jewelry, make-up or nail polish.  Do not wear lotions, powders, or perfumes. You may NOT wear deodorant the day of surgery.  Do not shave underarms & legs 48 hours prior to surgery.    Do not bring valuables to the hospital.  [facility] is not responsible for any belongings or valuables.               Contacts, dentures or bridgework may not be worn into surgery.  Leave suitcase in the car. After surgery it may be brought to your room.  For patients admitted to the hospital, discharge time is determined by your treatment team.                Name and phone number of your driver:    Special Instructions: "Preparing for Surgery" instruction sheet.   Please read over the following fact sheets that you were given: Pain Booklet, Coughing and Deep Breathing, Blood Transfusion Information, MRSA Information and Surgical Site Infection Prevention

## 2015-04-10 LAB — URINE CULTURE
COLONY COUNT: NO GROWTH
CULTURE: NO GROWTH

## 2015-04-10 NOTE — H&P (Signed)
CHIEF COMPLAINT:  Painful right knee.   HISTORY OF PRESENT ILLNESS:  Lesli is a very pleasant, 59 year old, white female who is seen today for evaluation of her right knee.  She has been noted to have osteoarthritis on prior films and some near collapse of the medial compartment with large osteophytes.  She is finding herself having more difficulty with walking, and it is unfortunately to the point where she has had increasing symptoms and difficulty with her activities of daily living.  She had an injection of cortisone on December 01, 2014 which really did not make much of a difference.  An MRI scan was ordered which revealed a progressive full thickness radial tear involving the posterior horn of the medial meniscus with detachment from the meniscal root and medial protrusion of the meniscus.  She has progressive medial compartment changes with a full thickness area of cartilage loss and subchondral cystic changes.  There are intact ligamentous structures otherwise.  She also had a moderate size joint effusion with synovitis.  She has gone through 2 arthroscopies previously on her other knee and viscosupplementation before a total knee replacement.  She is to the point now where she does not want to consider temporary conservative measures because of continued pain and discomfort after her last ones.  Therefore, she comes in today for a discussion of a possible total joint replacement.   CURRENT MEDICATIONS:  1.  Zoloft 50 mg daily. 2.  Lorazepam 0.5 mg nightly. 3.  Meloxicam 15 mg daily. 4.  Benefiber daily.   ALLERGIES:  NO KNOWN DRUG ALLERGIES.   PAST MEDICAL HISTORY:  In general, her health is good.   PAST SURGICAL HISTORY:  Hospitalizations include: 1.  In [place], C-sections. 2.  In December 2013, a hysterectomy, that of a total abdominal hysterectomy and bilateral salpingo oophorectomy. 3.  In 2014, repair of torn meniscus of the left knee. 4.  In December 2014, left knee  replacement. 5.  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 6.  She had a hospitalization without surgery in 1982 for a kidney infection.   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  She smokes 1/2 pack a day of cigarettes for 40 years.  She does drink 2-3 times a week, 2 glasses of wine.   FAMILY HISTORY:  Positive for a mother who is age 69 and remains alive.  She has had a myocardial infarction in the past as well as hypertension.  She had a father who died at age 42 from a motor vehicle accident.  She has no brothers.  She has 3 half sisters only.   REVIEW OF SYSTEMS:  Fourteen point review of systems is positive for glasses as well as depression.  Otherwise, all symptomatology was denied.   PHYSICAL EXAMINATION:  The patient is 68 inches.  Her weight is 200 pounds.  BMI is 30.4. Vital signs:  Temperature 97.9.  Pulse 79.  Respirations 14.  Blood pressure 122/76. HEENT:  Head is normocephalic.  Eyes show pupils are equal, round, and reactive to light and accommodation with extraocular movements intact.  Ears, nose, and throat were benign. Chest:  Good expansion. Lungs:  Essentially clear. Neck:  No bruits were noted. Cardiac:  Regular rhythm and rate.  Normal S1 and S2.  No murmurs, rubs, or gallops appreciated. Reflexes:  1+ bilateral and symmetric in the lower extremities. Neurological:  She is oriented x3, and cranial nerves II-XII are grossly intact. Abdomen:  Scaphoid soft and nontender.  No mass palpable.  Normal bowel sounds present. Genital:  Not indicated for an orthopedic evaluation. Rectal:  Not indicated for an orthopedic evaluation. Breasts:  Not indicated for an orthopedic evaluation. Musculoskeletal:  Today, she lacks about 5-7 degrees of full extension.  She flexes to about 95 degrees.  She does have crepitance with range of motion.  She has trace effusion.  She does have some  pseudolaxity with varus and valgus stressing.  She is tender about the knee, more lateral than medially.   RADIOGRAPHS:  Reveals the right knee with essentially bone-on-bone end stage osteoarthritis with sclerosis of both the medial tibia and to a lesser extent the medial femoral condyle.  She does have slight translation of the femur on the tibia.  She does have some patellofemoral narrowing and spurring noted.   CLINICAL IMPRESSION:   1.  End stage primary osteoarthritis of the right knee. 2.  History of depression.   RECOMMENDATIONS:   1.  At this time, I have reviewed a clearance form from Dr. Harlan Stains stating that she feels that Ziana is a candidate for a total knee replacement regarding from her medical and cardiac standpoint. 2.  At this time then, we will plan on proceeding with a right total knee arthroplasty.  The procedure, risks, and benefits were fully explained to the patient in detail, and she is understanding.   Mike Craze [place], [place] [phone]  04/10/2015 4:05 PM

## 2015-04-21 ENCOUNTER — Inpatient Hospital Stay (HOSPITAL_COMMUNITY): Payer: BLUE CROSS/BLUE SHIELD | Admitting: Anesthesiology

## 2015-04-21 ENCOUNTER — Encounter (HOSPITAL_COMMUNITY): Payer: Self-pay | Admitting: *Deleted

## 2015-04-21 ENCOUNTER — Inpatient Hospital Stay (HOSPITAL_COMMUNITY)
Admission: RE | Admit: 2015-04-21 | Discharge: 2015-04-23 | DRG: 470 | Disposition: A | Discharge status: Home / Self Care | Payer: BLUE CROSS/BLUE SHIELD | Source: Ambulatory Visit | Attending: Orthopaedic Surgery | Admitting: Orthopaedic Surgery

## 2015-04-21 ENCOUNTER — Encounter (HOSPITAL_COMMUNITY)
Admission: RE | Disposition: A | Discharge status: Home / Self Care | Payer: Self-pay | Source: Ambulatory Visit | Attending: Orthopaedic Surgery

## 2015-04-21 DIAGNOSIS — M659 Synovitis and tenosynovitis, unspecified: Secondary | ICD-10-CM | POA: Diagnosis present

## 2015-04-21 DIAGNOSIS — Z87891 Personal history of nicotine dependence: Secondary | ICD-10-CM | POA: Diagnosis not present

## 2015-04-21 DIAGNOSIS — Z90710 Acquired absence of both cervix and uterus: Secondary | ICD-10-CM | POA: Diagnosis not present

## 2015-04-21 DIAGNOSIS — F419 Anxiety disorder, unspecified: Secondary | ICD-10-CM | POA: Diagnosis present

## 2015-04-21 DIAGNOSIS — M1710 Unilateral primary osteoarthritis, unspecified knee: Secondary | ICD-10-CM | POA: Diagnosis present

## 2015-04-21 DIAGNOSIS — K219 Gastro-esophageal reflux disease without esophagitis: Secondary | ICD-10-CM | POA: Diagnosis present

## 2015-04-21 DIAGNOSIS — M1711 Unilateral primary osteoarthritis, right knee: Secondary | ICD-10-CM | POA: Diagnosis present

## 2015-04-21 DIAGNOSIS — Z85828 Personal history of other malignant neoplasm of skin: Secondary | ICD-10-CM

## 2015-04-21 DIAGNOSIS — R509 Fever, unspecified: Secondary | ICD-10-CM

## 2015-04-21 DIAGNOSIS — F329 Major depressive disorder, single episode, unspecified: Secondary | ICD-10-CM | POA: Diagnosis present

## 2015-04-21 DIAGNOSIS — M25561 Pain in right knee: Secondary | ICD-10-CM | POA: Diagnosis present

## 2015-04-21 HISTORY — PX: TOTAL KNEE ARTHROPLASTY: SHX125

## 2015-04-21 LAB — TYPE AND SCREEN
ABO/RH(D): O NEG
Antibody Screen: NEGATIVE

## 2015-04-21 SURGERY — ARTHROPLASTY, KNEE, TOTAL
Anesthesia: Spinal | Site: Knee | Laterality: Right

## 2015-04-21 MED ORDER — DOCUSATE SODIUM 100 MG PO CAPS
100.0000 mg | ORAL_CAPSULE | Freq: Two times a day (BID) | ORAL | Status: DC
  Administered 2015-04-21 – 2015-04-23 (×4): 100 mg via ORAL
  Filled 2015-04-21 (×4): qty 1

## 2015-04-21 MED ORDER — METOCLOPRAMIDE HCL 5 MG PO TABS: 5.0000 mg | ORAL_TABLET | Freq: Three times a day (TID) | ORAL | Status: DC | PRN

## 2015-04-21 MED ORDER — SODIUM CHLORIDE 0.9 % IV SOLN
INTRAVENOUS | Status: DC
  Administered 2015-04-21: 19:00:00 via INTRAVENOUS

## 2015-04-21 MED ORDER — OXYCODONE HCL 5 MG PO TABS
5.0000 mg | ORAL_TABLET | ORAL | Status: DC | PRN
  Administered 2015-04-21 – 2015-04-23 (×13): 10 mg via ORAL
  Filled 2015-04-21 (×13): qty 2

## 2015-04-21 MED ORDER — ROPIVACAINE HCL 5 MG/ML IJ SOLN
INTRAMUSCULAR | Status: DC | PRN
  Administered 2015-04-21: 30 mL

## 2015-04-21 MED ORDER — SUCCINYLCHOLINE CHLORIDE 20 MG/ML IJ SOLN
INTRAMUSCULAR | Status: AC
  Filled 2015-04-21: qty 1

## 2015-04-21 MED ORDER — EPHEDRINE SULFATE 50 MG/ML IJ SOLN
INTRAMUSCULAR | Status: AC
  Filled 2015-04-21: qty 1

## 2015-04-21 MED ORDER — ACETAMINOPHEN 10 MG/ML IV SOLN: 1000.0000 mg | Freq: Once | INTRAVENOUS | Status: DC

## 2015-04-21 MED ORDER — PHENYLEPHRINE 40 MCG/ML (10ML) SYRINGE FOR IV PUSH (FOR BLOOD PRESSURE SUPPORT)
PREFILLED_SYRINGE | INTRAVENOUS | Status: AC
  Filled 2015-04-21: qty 20

## 2015-04-21 MED ORDER — SERTRALINE HCL 50 MG PO TABS
50.0000 mg | ORAL_TABLET | Freq: Every day | ORAL | Status: DC
  Administered 2015-04-22 – 2015-04-23 (×2): 50 mg via ORAL
  Filled 2015-04-21 (×2): qty 1

## 2015-04-21 MED ORDER — FENTANYL CITRATE (PF) 100 MCG/2ML IJ SOLN
INTRAMUSCULAR | Status: DC | PRN
  Administered 2015-04-21: 50 ug via INTRAVENOUS

## 2015-04-21 MED ORDER — ONDANSETRON HCL 4 MG PO TABS: 4.0000 mg | ORAL_TABLET | Freq: Four times a day (QID) | ORAL | Status: DC | PRN

## 2015-04-21 MED ORDER — PHENOL 1.4 % MT LIQD: 1.0000 | OROMUCOSAL | Status: DC | PRN

## 2015-04-21 MED ORDER — CEFAZOLIN SODIUM-DEXTROSE 2-3 GM-% IV SOLR
INTRAVENOUS | Status: AC
  Administered 2015-04-21: 2 g via INTRAVENOUS
  Filled 2015-04-21: qty 50

## 2015-04-21 MED ORDER — MAGNESIUM CITRATE PO SOLN: 1.0000 | Freq: Once | ORAL | Status: AC | PRN

## 2015-04-21 MED ORDER — HYDROMORPHONE HCL 1 MG/ML IJ SOLN
0.2500 mg | INTRAMUSCULAR | Status: DC | PRN
  Administered 2015-04-21 (×2): 0.5 mg via INTRAVENOUS

## 2015-04-21 MED ORDER — PHENYLEPHRINE HCL 10 MG/ML IJ SOLN
INTRAMUSCULAR | Status: AC
  Filled 2015-04-21: qty 1

## 2015-04-21 MED ORDER — LACTATED RINGERS IV SOLN
INTRAVENOUS | Status: DC | PRN
  Administered 2015-04-21 (×2): via INTRAVENOUS

## 2015-04-21 MED ORDER — SODIUM CHLORIDE 0.9 % IJ SOLN
INTRAMUSCULAR | Status: AC
  Filled 2015-04-21: qty 10

## 2015-04-21 MED ORDER — BUPIVACAINE IN DEXTROSE 0.75-8.25 % IT SOLN
INTRATHECAL | Status: DC | PRN
  Administered 2015-04-21: 15 mg via INTRATHECAL

## 2015-04-21 MED ORDER — BISACODYL 10 MG RE SUPP: 10.0000 mg | Freq: Every day | RECTAL | Status: DC | PRN

## 2015-04-21 MED ORDER — ONDANSETRON HCL 4 MG/2ML IJ SOLN
INTRAMUSCULAR | Status: AC
  Filled 2015-04-21: qty 2

## 2015-04-21 MED ORDER — KETOROLAC TROMETHAMINE 15 MG/ML IJ SOLN
15.0000 mg | Freq: Four times a day (QID) | INTRAMUSCULAR | Status: AC
  Administered 2015-04-21 – 2015-04-22 (×4): 15 mg via INTRAVENOUS
  Filled 2015-04-21 (×4): qty 1

## 2015-04-21 MED ORDER — PROPOFOL INFUSION 10 MG/ML OPTIME
INTRAVENOUS | Status: DC | PRN
  Administered 2015-04-21: 50 ug/kg/min via INTRAVENOUS

## 2015-04-21 MED ORDER — BUPIVACAINE-EPINEPHRINE 0.25% -1:200000 IJ SOLN
INTRAMUSCULAR | Status: DC | PRN
  Administered 2015-04-21: 30 mL

## 2015-04-21 MED ORDER — METHOCARBAMOL 1000 MG/10ML IJ SOLN
500.0000 mg | Freq: Four times a day (QID) | INTRAVENOUS | Status: DC | PRN
  Filled 2015-04-21: qty 5

## 2015-04-21 MED ORDER — PROPOFOL 10 MG/ML IV BOLUS
INTRAVENOUS | Status: AC
  Filled 2015-04-21: qty 20

## 2015-04-21 MED ORDER — RIVAROXABAN 10 MG PO TABS
10.0000 mg | ORAL_TABLET | Freq: Every day | ORAL | Status: DC
  Administered 2015-04-22 – 2015-04-23 (×2): 10 mg via ORAL
  Filled 2015-04-21 (×2): qty 1

## 2015-04-21 MED ORDER — LORAZEPAM 0.5 MG PO TABS
0.7500 mg | ORAL_TABLET | Freq: Every evening | ORAL | Status: DC | PRN
Start: 2015-04-21 — End: 2015-04-23

## 2015-04-21 MED ORDER — LIDOCAINE HCL (CARDIAC) 20 MG/ML IV SOLN
INTRAVENOUS | Status: AC
  Filled 2015-04-21: qty 5

## 2015-04-21 MED ORDER — ACETAMINOPHEN 10 MG/ML IV SOLN
1000.0000 mg | Freq: Four times a day (QID) | INTRAVENOUS | Status: AC
  Administered 2015-04-21 – 2015-04-22 (×4): 1000 mg via INTRAVENOUS
  Filled 2015-04-21 (×5): qty 100

## 2015-04-21 MED ORDER — CHLORHEXIDINE GLUCONATE 4 % EX LIQD: 60.0000 mL | Freq: Once | CUTANEOUS | Status: DC

## 2015-04-21 MED ORDER — METOCLOPRAMIDE HCL 5 MG/ML IJ SOLN
5.0000 mg | Freq: Three times a day (TID) | INTRAMUSCULAR | Status: DC | PRN
Start: 2015-04-21 — End: 2015-04-23

## 2015-04-21 MED ORDER — ONDANSETRON HCL 4 MG/2ML IJ SOLN
4.0000 mg | Freq: Four times a day (QID) | INTRAMUSCULAR | Status: DC | PRN
  Administered 2015-04-21: 4 mg via INTRAVENOUS
  Filled 2015-04-21: qty 2

## 2015-04-21 MED ORDER — CEFAZOLIN SODIUM-DEXTROSE 2-3 GM-% IV SOLR
2.0000 g | Freq: Four times a day (QID) | INTRAVENOUS | Status: AC
  Administered 2015-04-21: 2 g via INTRAVENOUS
  Filled 2015-04-21 (×2): qty 50

## 2015-04-21 MED ORDER — MIDAZOLAM HCL 5 MG/5ML IJ SOLN
INTRAMUSCULAR | Status: DC | PRN
  Administered 2015-04-21: 2 mg via INTRAVENOUS

## 2015-04-21 MED ORDER — ROCURONIUM BROMIDE 50 MG/5ML IV SOLN
INTRAVENOUS | Status: AC
  Filled 2015-04-21: qty 1

## 2015-04-21 MED ORDER — SODIUM CHLORIDE 0.9 % IV SOLN
10.0000 mg | INTRAVENOUS | Status: DC | PRN
  Administered 2015-04-21: 15 ug/min via INTRAVENOUS

## 2015-04-21 MED ORDER — DIPHENHYDRAMINE HCL 12.5 MG/5ML PO ELIX: 12.5000 mg | ORAL_SOLUTION | ORAL | Status: DC | PRN

## 2015-04-21 MED ORDER — CEFAZOLIN SODIUM-DEXTROSE 2-3 GM-% IV SOLR: 2.0000 g | INTRAVENOUS | Status: DC

## 2015-04-21 MED ORDER — LACTATED RINGERS IV SOLN: INTRAVENOUS | Status: DC

## 2015-04-21 MED ORDER — METHOCARBAMOL 500 MG PO TABS
500.0000 mg | ORAL_TABLET | Freq: Four times a day (QID) | ORAL | Status: DC | PRN
  Administered 2015-04-21 – 2015-04-23 (×4): 500 mg via ORAL
  Filled 2015-04-21 (×4): qty 1

## 2015-04-21 MED ORDER — MENTHOL 3 MG MT LOZG
1.0000 | LOZENGE | OROMUCOSAL | Status: DC | PRN
  Administered 2015-04-21: 3 mg via ORAL
  Filled 2015-04-21: qty 9

## 2015-04-21 MED ORDER — EPHEDRINE SULFATE 50 MG/ML IJ SOLN
INTRAMUSCULAR | Status: DC | PRN
  Administered 2015-04-21: 5 mg via INTRAVENOUS

## 2015-04-21 MED ORDER — 0.9 % SODIUM CHLORIDE (POUR BTL) OPTIME
TOPICAL | Status: DC | PRN
  Administered 2015-04-21: 1000 mL

## 2015-04-21 MED ORDER — HYDROMORPHONE HCL 1 MG/ML IJ SOLN
INTRAMUSCULAR | Status: AC
  Filled 2015-04-21: qty 1

## 2015-04-21 MED ORDER — ALUM & MAG HYDROXIDE-SIMETH 200-200-20 MG/5ML PO SUSP: 30.0000 mL | ORAL | Status: DC | PRN

## 2015-04-21 MED ORDER — LIDOCAINE HCL (CARDIAC) 20 MG/ML IV SOLN
INTRAVENOUS | Status: DC | PRN
  Administered 2015-04-21: 60 mg via INTRAVENOUS

## 2015-04-21 MED ORDER — SODIUM CHLORIDE 0.9 % IV SOLN: INTRAVENOUS | Status: DC

## 2015-04-21 MED ORDER — HYDROMORPHONE HCL 1 MG/ML IJ SOLN
0.5000 mg | INTRAMUSCULAR | Status: DC | PRN
  Administered 2015-04-21 – 2015-04-22 (×4): 1 mg via INTRAVENOUS
  Filled 2015-04-21 (×4): qty 1

## 2015-04-21 MED ORDER — BUPIVACAINE-EPINEPHRINE (PF) 0.25% -1:200000 IJ SOLN
INTRAMUSCULAR | Status: AC
  Filled 2015-04-21: qty 30

## 2015-04-21 MED ORDER — ACETAMINOPHEN 10 MG/ML IV SOLN
INTRAVENOUS | Status: AC
  Administered 2015-04-21: 1000 mg via INTRAVENOUS
  Filled 2015-04-21: qty 100

## 2015-04-21 MED ORDER — PHENYLEPHRINE HCL 10 MG/ML IJ SOLN
INTRAMUSCULAR | Status: DC | PRN
  Administered 2015-04-21 (×2): 80 ug via INTRAVENOUS

## 2015-04-21 MED ORDER — POLYETHYLENE GLYCOL 3350 17 G PO PACK: 17.0000 g | PACK | Freq: Every day | ORAL | Status: DC | PRN

## 2015-04-21 MED ORDER — MIDAZOLAM HCL 2 MG/2ML IJ SOLN
INTRAMUSCULAR | Status: AC
  Filled 2015-04-21: qty 2

## 2015-04-21 MED ORDER — FENTANYL CITRATE (PF) 250 MCG/5ML IJ SOLN
INTRAMUSCULAR | Status: AC
  Filled 2015-04-21: qty 5

## 2015-04-21 SURGICAL SUPPLY — 66 items
BANDAGE ESMARK 6X9 LF (GAUZE/BANDAGES/DRESSINGS) ×1 IMPLANT
BLADE SAGITTAL 25.0X1.19X90 (BLADE) ×2 IMPLANT
BLADE SAGITTAL 25.0X1.19X90MM (BLADE) ×1
BNDG ESMARK 6X9 LF (GAUZE/BANDAGES/DRESSINGS) ×3
BOWL SMART MIX CTS (DISPOSABLE) ×3 IMPLANT
CAPT KNEE TOTAL 3 ATTUNE ×3 IMPLANT
CEMENT HV SMART SET (Cement) ×6 IMPLANT
COVER SURGICAL LIGHT HANDLE (MISCELLANEOUS) ×9 IMPLANT
CUFF TOURNIQUET SINGLE 34IN LL (TOURNIQUET CUFF) ×3 IMPLANT
CUFF TOURNIQUET SINGLE 44IN (TOURNIQUET CUFF) IMPLANT
DRAPE EXTREMITY T 121X128X90 (DRAPE) ×3 IMPLANT
DRAPE IMP U-DRAPE 54X76 (DRAPES) ×3 IMPLANT
DRAPE PROXIMA HALF (DRAPES) ×3 IMPLANT
DRSG ADAPTIC 3X8 NADH LF (GAUZE/BANDAGES/DRESSINGS) ×3 IMPLANT
DRSG PAD ABDOMINAL 8X10 ST (GAUZE/BANDAGES/DRESSINGS) ×3 IMPLANT
DURAPREP 26ML APPLICATOR (WOUND CARE) ×6 IMPLANT
ELECT CAUTERY BLADE 6.4 (BLADE) ×3 IMPLANT
ELECT REM PT RETURN 9FT ADLT (ELECTROSURGICAL) ×3
ELECTRODE REM PT RTRN 9FT ADLT (ELECTROSURGICAL) ×1 IMPLANT
EVACUATOR 1/8 PVC DRAIN (DRAIN) ×3 IMPLANT
FACESHIELD WRAPAROUND (MASK) ×6 IMPLANT
GAUZE SPONGE 4X4 12PLY STRL (GAUZE/BANDAGES/DRESSINGS) ×3 IMPLANT
GLOVE BIOGEL PI IND STRL 8 (GLOVE) ×1 IMPLANT
GLOVE BIOGEL PI IND STRL 8.5 (GLOVE) ×1 IMPLANT
GLOVE BIOGEL PI INDICATOR 8 (GLOVE) ×2
GLOVE BIOGEL PI INDICATOR 8.5 (GLOVE) ×2
GLOVE ECLIPSE 8.0 STRL XLNG CF (GLOVE) ×6 IMPLANT
GLOVE SURG ORTHO 8.5 STRL (GLOVE) ×6 IMPLANT
GOWN STRL REUS W/ TWL LRG LVL3 (GOWN DISPOSABLE) ×2 IMPLANT
GOWN STRL REUS W/TWL 2XL LVL3 (GOWN DISPOSABLE) ×3 IMPLANT
GOWN STRL REUS W/TWL LRG LVL3 (GOWN DISPOSABLE) ×4
HANDPIECE INTERPULSE COAX TIP (DISPOSABLE) ×2
KIT BASIN OR (CUSTOM PROCEDURE TRAY) ×3 IMPLANT
KIT ROOM TURNOVER OR (KITS) ×3 IMPLANT
MANIFOLD NEPTUNE II (INSTRUMENTS) ×3 IMPLANT
NEEDLE 22X1 1/2 (OR ONLY) (NEEDLE) IMPLANT
NS IRRIG 1000ML POUR BTL (IV SOLUTION) ×3 IMPLANT
PACK TOTAL JOINT (CUSTOM PROCEDURE TRAY) ×3 IMPLANT
PACK UNIVERSAL I (CUSTOM PROCEDURE TRAY) ×3 IMPLANT
PAD ARMBOARD 7.5X6 YLW CONV (MISCELLANEOUS) ×3 IMPLANT
PAD CAST 4YDX4 CTTN HI CHSV (CAST SUPPLIES) ×1 IMPLANT
PADDING CAST ABS 4INX4YD NS (CAST SUPPLIES) ×2
PADDING CAST ABS 6INX4YD NS (CAST SUPPLIES) ×2
PADDING CAST ABS COTTON 4X4 ST (CAST SUPPLIES) ×1 IMPLANT
PADDING CAST ABS COTTON 6X4 NS (CAST SUPPLIES) ×1 IMPLANT
PADDING CAST COTTON 4X4 STRL (CAST SUPPLIES) ×2
PADDING CAST COTTON 6X4 STRL (CAST SUPPLIES) ×3 IMPLANT
SET HNDPC FAN SPRY TIP SCT (DISPOSABLE) ×1 IMPLANT
SPONGE GAUZE 4X4 12PLY STER LF (GAUZE/BANDAGES/DRESSINGS) ×3 IMPLANT
STAPLER VISISTAT 35W (STAPLE) ×3 IMPLANT
SUCTION FRAZIER TIP 10 FR DISP (SUCTIONS) ×3 IMPLANT
SURGIFLO W/THROMBIN 8M KIT (HEMOSTASIS) IMPLANT
SUT BONE WAX W31G (SUTURE) ×3 IMPLANT
SUT ETHIBOND NAB CT1 #1 30IN (SUTURE) ×9 IMPLANT
SUT MNCRL AB 3-0 PS2 18 (SUTURE) ×3 IMPLANT
SUT VIC AB 0 CT1 27 (SUTURE) ×2
SUT VIC AB 0 CT1 27XBRD ANBCTR (SUTURE) ×1 IMPLANT
SUT VIC AB 1 CT1 27 (SUTURE) ×2
SUT VIC AB 1 CT1 27XBRD ANBCTR (SUTURE) ×1 IMPLANT
SYR CONTROL 10ML LL (SYRINGE) ×3 IMPLANT
TOWEL OR 17X24 6PK STRL BLUE (TOWEL DISPOSABLE) ×3 IMPLANT
TOWEL OR 17X26 10 PK STRL BLUE (TOWEL DISPOSABLE) ×3 IMPLANT
TRAY CATH 16FR W/PLASTIC CATH (SET/KITS/TRAYS/PACK) ×3 IMPLANT
TRAY FOLEY CATH 16FRSI W/METER (SET/KITS/TRAYS/PACK) IMPLANT
WATER STERILE IRR 1000ML POUR (IV SOLUTION) IMPLANT
WRAP KNEE MAXI GEL POST OP (GAUZE/BANDAGES/DRESSINGS) ×3 IMPLANT

## 2015-04-21 NOTE — Anesthesia Postprocedure Evaluation (Signed)
  Anesthesia Post-op Note  Patient: Rebecca Huff  Procedure(s) Performed: Procedure(s): TOTAL KNEE ARTHROPLASTY (Right)  Patient Location: PACU  Anesthesia Type: Spinal/MAC  Level of Consciousness: awake and alert   Airway and Oxygen Therapy: Patient Spontanous Breathing  Post-op Pain: mild  Post-op Assessment: Post-op Vital signs reviewed, Patient's Cardiovascular Status Stable and Respiratory Function Stable. No residual motor block.  Post-op Vital Signs: Reviewed  Filed Vitals:   04/21/15 1205  BP:   Pulse:   Temp: 36.2 C  Resp:     Complications: No apparent anesthesia complications

## 2015-04-21 NOTE — Transfer of Care (Signed)
Immediate Anesthesia Transfer of Care Note  Patient: Rebecca Huff  Procedure(s) Performed: Procedure(s): TOTAL KNEE ARTHROPLASTY (Right)  Patient Location: PACU  Anesthesia Type:Spinal  Level of Consciousness: awake, alert  and oriented  Airway & Oxygen Therapy: Patient Spontanous Breathing  Post-op Assessment: Report given to RN, Post -op Vital signs reviewed and stable and Patient moving all extremities X 4  Post vital signs: Reviewed and stable  Last Vitals:  Filed Vitals:   04/21/15 0604  BP: 118/72  Pulse: 76  Temp: 37 C  Resp: 20    Complications: No apparent anesthesia complications

## 2015-04-21 NOTE — Progress Notes (Signed)
Orthopedic Tech Progress Note Patient Details:  Rebecca Huff Jun 26, 1956 [id number]  Ortho Devices Type of Ortho Device: Knee Immobilizer Ortho Device/Splint Location: RLE Ortho Device/Splint Interventions: Ordered, Application   [name] 04/21/2015, 9:29 PM

## 2015-04-21 NOTE — Progress Notes (Signed)
Orthopedic Tech Progress Note Patient Details:  Rebecca Huff 21-Mar-1956 [id number] Off cpm at 7:05 pm Patient ID: Rebecca Huff, female   DOB: 08-08-1956, 59 y.o.   MRN: [id number]   [name] 04/21/2015, 7:07 PM

## 2015-04-21 NOTE — Op Note (Signed)
[name], [name] NO.:  [id number]  MEDICAL RECORD NO.:  [id number]  LOCATION:  5N05C                        FACILITY:  [facility]  PHYSICIAN:  Vonna Kotyk. Loetta Connelley, M.D.DATE OF BIRTH:  [birth year]  DATE OF PROCEDURE:  04/21/2015 DATE OF DISCHARGE:                              OPERATIVE REPORT   PREOPERATIVE DIAGNOSIS:  Primary, end-stage osteoarthritis, right knee.  POSTOPERATIVE DIAGNOSIS:  Primary, end-stage osteoarthritis, right knee.  PROCEDURE:  Right total knee replacement.  SURGEON:  Vonna Kotyk. [name], M.D.  ASSISTANT:  Aaron Edelman D. Petrarca, PA-C  ANESTHESIA:  Spinal with supplemental adductor canal block.  COMPLICATIONS:  None.  COMPONENTS:  DePuy LCS standard plus femoral component, a #2.5 rotating keeled tibial tray with a 12.5-mm bridging bearing and 3-peg metal back rotating patella.  Components were secured with polymethyl methacrylate.  DESCRIPTION OF PROCEDURE:  Ms. Rebecca Huff was met in the holding area, identified the right knee as appropriate operative site, and marked it accordingly.  She did receive a preoperative adductor canal block per Anesthesia.  The patient was then transported to room #7 where spinal anesthesia was performed by Anesthesia.  The patient was then placed comfortably in supine position on the operating table.  The right lower extremity was placed in a thigh tourniquet.  The leg was then prepped with chlorhexidine scrub and DuraPrep x2.  Time-out was called.  Sterile draping was performed.  With the extremity elevated, it was Esmarch exsanguinated with a proximal tourniquet at 350 mmHg.  A midline longitudinal incision was made, centered about the patella, extending from the superior pouch to the tibial tubercle.  Via sharp dissection, incision was carried down to the subcutaneous tissue.  First layer of capsule was incised in the midline.  A medial parapatellar incision was then made with the Bovie.  Joint was  entered.  There was minimal clear yellow joint effusion.  The patella was everted to 180 degrees laterally and the knee flexed to 90 degrees.  There were moderate-sized osteophytes along the medial and lateral femoral condyle and the medial tibial plateau, these were removed. There was a moderate amount of beefy red inflammatory synovitis, this was resected as well.  At that point, I measured a standard plus femoral component.  The first bony cut was made transversely in the proximal tibia with a 7- degree angle of declination.  After of each bony cut on the tibia and the femur, I checked the alignment with the external guide.  Subsequent cuts were then made on the femur using the standard plus guide.  I used a 4-degree distal femoral valgus cut.  Laminar spreaders were placed along the medial and lateral compartments, so that I could remove medial and lateral menisci, ACL and PCL.  Osteophytes were removed from the posterior femoral condyle using a 3/4-inch curved osteotome.  There was one small loose body identified posterolaterally that measured about 3-4 mm in diameter.  There was a large bulky fabella was partially excised.  I did check flexion-extension gaps, symmetrical at 12.5 mm.  MCL and LCL remained intact throughout the operative procedure.  The final cut was then made on the femur using the standard plus femoral jig for tapering cuts and  for the center hole.  Retractors were then placed about the tibia, was advanced anteriorly, measured 2.5 tibial tray.  This was pinned in place.  Center hole was then made followed by the keeled cut.  With the tibial jig in place, a 12.5-mm polyethylene trial component was inserted followed by the standard plus trial femoral component.  This was reduced and through a full range of motion with perfect stability, no opening with varus or valgus stress.  She did have a small flexion contracture preoperatively, which was now  corrected.  The patella was prepared by removing 10 mm of bone leaving 13 mm of patella thickness.  The three holes were then made with the trial patella and the trial patella was inserted and through full range of motion, it remained perfectly stable.  Trial components were removed.  Joint was then copiously irrigated with saline solution.  The final components were then impacted with polymethyl methacrylate, initially the tibial tray followed by the femoral component and a 12.5- mm polyethylene bridging bearing.  With the knee reduced, extraneous methacrylate was removed in the periphery of the components.  Patella was applied with methacrylate and a patellar clamp.  At approximately 16 minutes, the methacrylate to mature, during which time, we injected the joint with 0.25% Marcaine with epinephrine.  The tourniquet was deflated.  There was immediate capillary refill to the joints.  Gross bleeders were Bovie coagulated.  A medium-sized Hemovac was then placed to the lateral compartment.  The wound was again irrigated with saline solution.  The deep capsule was then closed with #1 Ethibond, superficial capsule with 0 Vicryl, subcu with 3-0 Monocryl.  Skin was closed with skin clips.  Sterile bulky dressing was applied followed by the patient's support stocking.  The patient tolerated the procedure without complications.     Vonna Kotyk. [name], M.D.     PWW/MEDQ  D:  04/21/2015  T:  04/21/2015  Job:  [id number]

## 2015-04-21 NOTE — Op Note (Signed)
PATIENT ID:      Rebecca Huff  MRN:     [id number] DOB/AGE:    1956-07-27 / 59 y.o.       OPERATIVE REPORT    DATE OF PROCEDURE:  04/21/2015       PREOPERATIVE DIAGNOSIS:  PRIMARY, END STAGE OSTEOARTHRITIS OF RIGHT KNEE                                                       Estimated body mass index is 30.11 kg/(m^2) as calculated from the following:   Height as of this encounter: 5\' 8"  (1.727 m).   Weight as of this encounter: 89.812 kg (198 lb).     POSTOPERATIVE DIAGNOSIS:   OSTEOARTHRITIS OF RIGHT KNEE-SAME                                                                     Estimated body mass index is 30.11 kg/(m^2) as calculated from the following:   Height as of this encounter: 5\' 8"  (1.727 m).   Weight as of this encounter: 89.812 kg (198 lb).     PROCEDURE:  Procedure(s):RIGHT TOTAL KNEE ARTHROPLASTY     SURGEON:  [name], MD    ASSISTANT:   Biagio Borg, PA-C   (Present and scrubbed throughout the case, critical for assistance with exposure, retraction, instrumentation, and closure.)          ANESTHESIA: regional and spinal     DRAINS: (RIGHT KNEE) Hemovact drain(s) in the CLAMPED with  Suction Clamped :      TOURNIQUET TIME: * Missing tourniquet times found for documented tourniquets in log:  [id number] *    COMPLICATIONS:  None   CONDITION:  stable  PROCEDURE IN DETAIL: [id number]   [name], [name] W 04/21/2015, 9:07 AM

## 2015-04-21 NOTE — Anesthesia Procedure Notes (Addendum)
Anesthesia Regional Block:  Adductor canal block  Pre-Anesthetic Checklist: ,, timeout performed, Correct Patient, Correct Site, Correct Laterality, Correct Procedure, Correct Position, site marked, Risks and benefits discussed, pre-op evaluation,  At surgeon's request and post-op pain management  Laterality: Right  Prep: Maximum Sterile Barrier Precautions used and chloraprep       Needles:  Injection technique: Single-shot  Needle Type: Echogenic Stimulator Needle     Needle Length: 5cm 5 cm Needle Gauge: 22 and 22 G    Additional Needles:  Procedures: ultrasound guided (picture in chart) Adductor canal block Narrative:  Start time: 04/21/2015 6:50 AM End time: 04/21/2015 7:00 AM Injection made incrementally with aspirations every 5 mL. Anesthesiologist: Roderic Palau  Additional Notes: 2% Lidocaine skin wheel.    Procedure Name: MAC Date/Time: 04/21/2015 7:37 AM Performed by: Neldon Newport Pre-anesthesia Checklist: Patient being monitored, Suction available, Emergency Drugs available, Patient identified and Timeout performed Patient Re-evaluated:Patient Re-evaluated prior to inductionOxygen Delivery Method: Nasal cannula Placement Confirmation: positive ETCO2    Spinal Patient location during procedure: OR Start time: 04/21/2015 7:25 AM End time: 04/21/2015 7:30 AM Staffing Anesthesiologist: Roderic Palau Performed by: anesthesiologist  Preanesthetic Checklist Completed: patient identified, surgical consent, pre-op evaluation, timeout performed, IV checked, risks and benefits discussed and monitors and equipment checked Spinal Block Patient position: sitting Prep: Betadine Patient monitoring: cardiac monitor, continuous pulse ox and blood pressure Approach: midline Location: L3-4 Injection technique: single-shot Needle Needle type: Pencan  Needle gauge: 24 G Needle length: 9 cm Assessment Sensory level: T8 Additional Notes Functioning IV  was confirmed and monitors were applied. Sterile prep and drape, including hand hygiene and sterile gloves were used. The patient was positioned and the spine was prepped. The skin was anesthetized with lidocaine.  Free flow of clear CSF was obtained prior to injecting local anesthetic into the CSF.  The spinal needle aspirated freely following injection.  The needle was carefully withdrawn.  The patient tolerated the procedure well.

## 2015-04-21 NOTE — Progress Notes (Signed)
Report given to philip lopez rn as caregiver 

## 2015-04-21 NOTE — H&P (Signed)
  The recent History & Physical has been reviewed. I have personally examined the patient today. There is no interval change to the documented History & Physical. The patient would like to proceed with the procedure.  [name] W 04/21/2015,  7:08 AM

## 2015-04-21 NOTE — Evaluation (Signed)
Physical Therapy Evaluation Patient Details Name: Rebecca Huff MRN: [id number] DOB: July 31, 1956 Today's Date: 04/21/2015   History of Present Illness  Pt is a 59 y/o F s/p R TKA.  Pt's PMH includes L TKA, anxiety, and depression.  Clinical Impression  Pt is s/p R TKA resulting in the deficits listed below (see PT Problem List). Pt limited by pain in R knee and lethargy.  Pt was able to adhere to PWB status on RLE while ambulating to chair this session.  Anticipate that pt will progress well w/ therapy once pain is controlled. Pt will benefit from skilled PT to increase their independence and safety with mobility to allow discharge to the venue listed below.     Follow Up Recommendations Home health PT;Supervision for mobility/OOB    Equipment Recommendations  None recommended by PT    Recommendations for Other Services OT consult     Precautions / Restrictions Precautions Precautions: Knee;Fall Precaution Booklet Issued: Yes (comment) Precaution Comments: Reviewed no pillow under knee Restrictions Weight Bearing Restrictions: Yes RLE Weight Bearing: Partial weight bearing RLE Partial Weight Bearing Percentage or Pounds: 50      Mobility  Bed Mobility Overal bed mobility: Needs Assistance Bed Mobility: Supine to Sit     Supine to sit: Min assist     General bed mobility comments: Min assist for managing RLE, mod use of bed rails, increased time, cues for sequencing.  Transfers Overall transfer level: Needs assistance Equipment used: Rolling walker (2 wheeled) Transfers: Sit to/from Stand Sit to Stand: Min assist         General transfer comment: Verbal cues for hand placement.  Min assist once standing EOB to maintain balance.    Ambulation/Gait Ambulation/Gait assistance: Min guard Ambulation Distance (Feet): 5 Feet Assistive device: Rolling walker (2 wheeled) Gait Pattern/deviations: Step-to pattern;Shuffle;Decreased stride length;Decreased weight shift to  right;Decreased stance time - right;Antalgic   Gait velocity interpretation: Below normal speed for age/gender General Gait Details: dec WB on RLE, shuffle LLE, inc WB through [facility] Mobility    Modified Rankin (Stroke Patients Only)       Balance Overall balance assessment: Needs assistance Sitting-balance support: Bilateral upper extremity supported;Feet supported Sitting balance-Leahy Scale: Fair     Standing balance support: Bilateral upper extremity supported;During functional activity Standing balance-Leahy Scale: Fair                               Pertinent Vitals/Pain Pain Assessment: 0-10 Pain Score: 8  Pain Location: R knee Pain Descriptors / Indicators: Aching;Throbbing;Sharp Pain Intervention(s): Limited activity within patient's tolerance;Monitored during session;Repositioned;Patient requesting pain meds-RN notified    Home Living Family/patient expects to be discharged to:: Private residence Living Arrangements: Alone Available Help at Discharge: Family;Available 24 hours/day (mother, 2 daughters to assist upon return home) Type of Home: Apartment Home Access: Stairs to enter Entrance Stairs-Rails: None Entrance Stairs-Number of Steps: 1 Home Layout: One level Home Equipment: Cane - single point;Walker - 2 wheels;Bedside commode      Prior Function Level of Independence: Independent               Hand Dominance   Dominant Hand: Right    Extremity/Trunk Assessment               Lower Extremity Assessment: RLE deficits/detail RLE Deficits / Details: weakness and limited ROM as expected s/p  R TKA       Communication   Communication: No difficulties  Cognition Arousal/Alertness: Lethargic Behavior During Therapy: WFL for tasks assessed/performed Overall Cognitive Status: Within Functional Limits for tasks assessed                      General Comments      Exercises Total  Joint Exercises Ankle Circles/Pumps: AROM;Both;10 reps;Supine Quad Sets: AROM;Both;5 reps;Supine Heel Slides: AROM;Right;5 reps;Supine Knee Flexion: AROM;Right;5 reps;Seated Goniometric ROM: 8-99      Assessment/Plan    PT Assessment Patient needs continued PT services  PT Diagnosis Difficulty walking;Abnormality of gait;Acute pain;Generalized weakness   PT Problem List Decreased strength;Decreased range of motion;Decreased activity tolerance;Decreased balance;Decreased mobility;Decreased coordination;Decreased knowledge of use of DME;Decreased safety awareness;Decreased knowledge of precautions;Decreased skin integrity;Pain  PT Treatment Interventions DME instruction;Gait training;Stair training;Functional mobility training;Therapeutic activities;Therapeutic exercise;Balance training;Neuromuscular re-education;Patient/family education;Modalities   PT Goals (Current goals can be found in the Care Plan section) Acute Rehab PT Goals Patient Stated Goal: to have pain decrease PT Goal Formulation: With patient/family Time For Goal Achievement: 04/28/15 Potential to Achieve Goals: Good    Frequency 7X/week   Barriers to discharge Inaccessible home environment 1 step to enter home    Co-evaluation               End of Session Equipment Utilized During Treatment: Gait belt Activity Tolerance: Patient limited by lethargy;Patient limited by pain Patient left: in chair;with call bell/phone within reach;with family/visitor present Nurse Communication: Mobility status;Precautions;Weight bearing status;Patient requests pain meds         Time: 3295-1884 PT Time Calculation (min) (ACUTE ONLY): 21 min   Charges:   PT Evaluation $Initial PT Evaluation Tier I: 1 Procedure     PT G Codes:       Joslyn Hy PT, DPT [phone] Pager: [phone] 04/21/2015, 3:07 PM

## 2015-04-21 NOTE — Anesthesia Preprocedure Evaluation (Addendum)
Anesthesia Evaluation  Patient identified by MRN, date of birth, ID band Patient awake    Reviewed: Allergy & Precautions, H&P , NPO status , Patient's Chart, lab work & pertinent test results  Airway Mallampati: I       Dental no notable dental hx. (+) Teeth Intact, Dental Advidsory Given   Pulmonary Current Smoker,  breath sounds clear to auscultation  Pulmonary exam normal       Cardiovascular negative cardio ROS  Rhythm:Regular Rate:Normal     Neuro/Psych Anxiety Depression negative neurological ROS     GI/Hepatic Neg liver ROS, GERD-  Controlled,  Endo/Other  negative endocrine ROS  Renal/GU negative Renal ROS  negative genitourinary   Musculoskeletal  (+) Arthritis -, Osteoarthritis,    Abdominal   Peds  Hematology negative hematology ROS (+)   Anesthesia Other Findings   Reproductive/Obstetrics negative OB ROS                          Anesthesia Physical Anesthesia Plan  ASA: II  Anesthesia Plan: Spinal   Post-op Pain Management: MAC Combined w/ Regional for Post-op pain   Induction: Intravenous  Airway Management Planned: Simple Face Mask  Additional Equipment:   Intra-op Plan:   Post-operative Plan:   Informed Consent: I have reviewed the patients History and Physical, chart, labs and discussed the procedure including the risks, benefits and alternatives for the proposed anesthesia with the patient or authorized representative who has indicated his/her understanding and acceptance.   Dental advisory given and Dental Advisory Given  Plan Discussed with: CRNA  Anesthesia Plan Comments:       Anesthesia Quick Evaluation

## 2015-04-21 NOTE — Progress Notes (Signed)
Orthopedic Tech Progress Note Patient Details:  Rebecca Huff 13-Feb-1956 [id number]  CPM Right Knee CPM Right Knee: On Right Knee Flexion (Degrees): 90 Right Knee Extension (Degrees): 0 Additional Comments: trapeze bar patient helper Viewed order from doctor's order list  Hildred Priest 04/21/2015, 10:26 AM

## 2015-04-22 ENCOUNTER — Encounter (HOSPITAL_COMMUNITY): Payer: Self-pay | Admitting: Orthopaedic Surgery

## 2015-04-22 LAB — CBC
HCT: 35 % — ABNORMAL LOW (ref 36.0–46.0)
Hemoglobin: 11.8 g/dL — ABNORMAL LOW (ref 12.0–15.0)
MCH: 31.2 pg (ref 26.0–34.0)
MCHC: 33.7 g/dL (ref 30.0–36.0)
MCV: 92.6 fL (ref 78.0–100.0)
Platelets: 180 10*3/uL (ref 150–400)
RBC: 3.78 MIL/uL — ABNORMAL LOW (ref 3.87–5.11)
RDW: 12.6 % (ref 11.5–15.5)
WBC: 9.1 10*3/uL (ref 4.0–10.5)

## 2015-04-22 LAB — BASIC METABOLIC PANEL
Anion gap: 7 (ref 5–15)
BUN: 7 mg/dL (ref 6–20)
CALCIUM: 8.4 mg/dL — AB (ref 8.9–10.3)
CHLORIDE: 101 mmol/L (ref 101–111)
CO2: 28 mmol/L (ref 22–32)
Creatinine, Ser: 0.8 mg/dL (ref 0.44–1.00)
GFR calc Af Amer: >60 mL/min (ref >60)
GFR calc non Af Amer: >60 mL/min (ref >60)
Glucose, Bld: 112 mg/dL — ABNORMAL HIGH (ref 65–99)
Potassium: 3.8 mmol/L (ref 3.5–5.1)
Sodium: 136 mmol/L (ref 135–145)

## 2015-04-22 NOTE — Progress Notes (Signed)
Orthopedic Tech Progress Note Patient Details:  Rebecca Huff 1956-04-06 [id number] On cpm at 7:00 pm Patient ID: Asmi Fugere, female   DOB: December 29, 1955, 59 y.o.   MRN: [id number]   [name] 04/22/2015, 7:02 PM

## 2015-04-22 NOTE — Progress Notes (Signed)
Occupational Therapy Evaluation Patient Details Name: Rebecca Huff MRN: [id number] DOB: Apr 08, 1956 Today's Date: 04/22/2015    History of Present Illness Pt is a 59 y/o F s/p R TKA.  Pt's PMH includes L TKA, anxiety, and depression.   Clinical Impression   Completed education regarding compensatory techniques for ADL and functional mobility for ADL with use of DME. Family present for education. Pt able to return demonstrate. Pt ready to D/C home when medically stable. No further OT needs. OT signing off.     Follow Up Recommendations  No OT follow up;Supervision/Assistance - 24 hour (initially)    Equipment Recommendations  None recommended by OT    Recommendations for Other Services       Precautions / Restrictions Precautions Precautions: Knee;Fall Restrictions Weight Bearing Restrictions: Yes RLE Weight Bearing: Partial weight bearing RLE Partial Weight Bearing Percentage or Pounds: 50      Mobility Bed Mobility               General bed mobility comments: Pt up in chair  Transfers Overall transfer level: Needs assistance Equipment used: Rolling walker (2 wheeled) Transfers: Sit to/from [facility] Sit to Stand: Supervision Stand pivot transfers: Min guard       General transfer comment: Good carry over of hand placement from earleir session with PT     Balance     Sitting balance-Leahy Scale: Fair       Standing balance-Leahy Scale: Fair                              ADL Overall ADL's : Needs assistance/impaired                                     Functional mobility during ADLs: Min guard General ADL Comments: Completed education regarding compensatory techniques for ADL and use of DME for functional mobility for ADL. Educated pt/family on use of tub bench for tub transfer while maintaining PWB status RLE. Pt able to retrun demonstrate. Pt has DME for home use. Daughter is COTA and is able to  assist as needed.                      Pertinent Vitals/Pain Pain Assessment: 0-10 Pain Score: 7  Pain Location: R knee Pain Descriptors / Indicators: Aching Pain Intervention(s): Limited activity within patient's tolerance;Monitored during session;Repositioned;Ice applied     Hand Dominance Right   Extremity/Trunk Assessment Upper Extremity Assessment Upper Extremity Assessment: Overall WFL for tasks assessed   Lower Extremity Assessment Lower Extremity Assessment: Defer to PT evaluation RLE Sensation:  (WNL)   Cervical / Trunk Assessment Cervical / Trunk Assessment: Normal   Communication Communication Communication: No difficulties   Cognition Arousal/Alertness: Awake/alert Behavior During Therapy: WFL for tasks assessed/performed Overall Cognitive Status: Within Functional Limits for tasks assessed                                        Home Living Family/patient expects to be discharged to:: Private residence Living Arrangements: Alone Available Help at Discharge: Family;Available 24 hours/day (mother, 2 daughters to assist upon return home) Type of Home: Apartment Home Access: Stairs to enter [employer] of Steps: 1 Entrance Stairs-Rails: None Home Layout: One level  Bathroom Shower/Tub: Risk analyst characteristics: Architectural technologist: Standard Bathroom Accessibility: Yes How Accessible: Accessible via walker Home Equipment: [place] - single point;Walker - 2 wheels;Bedside commode;Hand held shower head;Tub bench          Prior Functioning/Environment Level of Independence: Independent             OT Diagnosis: Generalized weakness;Acute pain   OT Problem List: Decreased strength;Decreased range of motion;Decreased activity tolerance;Decreased knowledge of use of DME or AE;Decreased knowledge of precautions;Pain   OT Treatment/Interventions:      OT Goals(Current goals can be found in the  care plan section) Acute Rehab OT Goals Patient Stated Goal: to have pain decrease OT Goal Formulation: All assessment and education complete, DC therapy  OT Frequency:     Barriers to D/C:            Co-evaluation              End of Session Equipment Utilized During Treatment: Rolling walker CPM Right Knee CPM Right Knee: Off Nurse Communication: Mobility status  Activity Tolerance: Patient tolerated treatment well Patient left: in chair;with call bell/phone within reach;with family/visitor present   Time: 1031-1055 OT Time Calculation (min): 24 min Charges:  OT General Charges $OT Visit: 1 Procedure OT Evaluation $Initial OT Evaluation Tier I: 1 Procedure OT Treatments $Self Care/Home Management : 8-22 mins G-Codes:    [place] Blucher,HILLARY 05/04/15, 11:03 AM   Maurie Boettcher, OTR/L  [phone] May 04, 2015

## 2015-04-22 NOTE — Progress Notes (Signed)
Physical Therapy Treatment Patient Details Name: Rebecca Huff MRN: [id number] DOB: 07-25-1956 Today's Date: 04/22/2015    History of Present Illness Pt is a 59 y/o F s/p R TKA.  Pt's PMH includes L TKA, anxiety, and depression.    PT Comments    Patient is making good progress with PT; however pt is limited 2/2 pain in R knee. Pt demonstrated ability to ambulate 60 ft and ascend/descend 1 step (x3) this session. Anticipate that once pt's pain is well controlled pt will improve ambulatory distance.    Follow Up Recommendations  Home health PT;Supervision for mobility/OOB     Equipment Recommendations  None recommended by PT    Recommendations for Other Services       Precautions / Restrictions Precautions Precautions: Knee;Fall Precaution Comments: Reviewed no pillow under knee Restrictions Weight Bearing Restrictions: Yes RLE Weight Bearing: Partial weight bearing RLE Partial Weight Bearing Percentage or Pounds: 50    Mobility  Bed Mobility               General bed mobility comments: Pt up in chair  Transfers Overall transfer level: Needs assistance Equipment used: Rolling walker (2 wheeled) Transfers: Sit to/from Stand Sit to Stand: Supervision Stand pivot transfers: Min guard       General transfer comment: Supervision for safety.  Cues to remind pt to WB through BLEs to tolerance.  Ambulation/Gait Ambulation/Gait assistance: Supervision Ambulation Distance (Feet): 60 Feet Assistive device: Rolling walker (2 wheeled) Gait Pattern/deviations: Step-to pattern;Antalgic;Trunk flexed;Decreased stance time - right;Decreased weight shift to right   Gait velocity interpretation: Below normal speed for age/gender General Gait Details: Inc WB through [place], step to gait pattern, trunk flexed   Stairs Stairs: Yes Stairs assistance: Min guard Stair Management: No rails;Backwards;With walker Number of Stairs: 1 (x3) General stair comments: Demonstration  and verbal cues for technique.    Wheelchair Mobility    Modified Rankin (Stroke Patients Only)       Balance Overall balance assessment: Needs assistance Sitting-balance support: No upper extremity supported;Feet supported Sitting balance-Leahy Scale: Fair     Standing balance support: Bilateral upper extremity supported;During functional activity Standing balance-Leahy Scale: Fair                      Cognition Arousal/Alertness: Awake/alert Behavior During Therapy: WFL for tasks assessed/performed Overall Cognitive Status: Within Functional Limits for tasks assessed                      Exercises Total Joint Exercises Long Arc Quad: AROM;Right;5 reps;Seated Knee Flexion: AROM;Right;5 reps;Seated Goniometric ROM: 4-96    General Comments        Pertinent Vitals/Pain Pain Assessment: 0-10 Pain Score: 8  Pain Location: R knee Pain Descriptors / Indicators: Aching;Constant;Grimacing Pain Intervention(s): Limited activity within patient's tolerance;Monitored during session;Repositioned;RN gave pain meds during session    Amelia expects to be discharged to:: Private residence Living Arrangements: Alone Available Help at Discharge: Family;Available 24 hours/day (mother, 2 daughters to assist upon return home) Type of Home: Apartment Home Access: Stairs to enter Entrance Stairs-Rails: None Home Layout: One level Home Equipment: Cane - single point;Walker - 2 wheels;Bedside commode;Hand held shower head;Tub bench      Prior Function Level of Independence: Independent          PT Goals (current goals can now be found in the care plan section) Acute Rehab PT Goals Patient Stated Goal: to have pain decrease Progress towards PT  goals: Progressing toward goals    Frequency  7X/week    PT Plan Current plan remains appropriate    Co-evaluation             End of Session Equipment Utilized During Treatment: Gait  belt Activity Tolerance: Patient limited by pain;Patient limited by fatigue Patient left: in chair;with call bell/phone within reach;with family/visitor present     Time: 7793-9030 PT Time Calculation (min) (ACUTE ONLY): 23 min  Charges:  $Gait Training: 8-22 mins $Therapeutic Exercise: 8-22 mins          G Codes:            Joslyn Hy PT, [place] [phone] Pager: [phone]     04/22/2015, 1:57 PM

## 2015-04-22 NOTE — Progress Notes (Signed)
Patient ID: Rebecca Huff, female   DOB: 01/26/1956, 59 y.o.   MRN: [id number] PATIENT ID: Rebecca Huff        MRN:  [id number]          DOB/AGE: Jul 21, 1956 / 59 y.o.    [name], MD   Biagio Borg, PA-C [address] [place], [place]  [address]                             [phone]   PROGRESS NOTE  Subjective:  negative for Chest Pain  negative for Shortness of Breath  negative for Nausea/Vomiting Some yesterday but none since.Marland KitchenMarland KitchenMarland KitchenHungry  negative for Calf Pain    Tolerating Diet: yes         Patient reports pain as mild and moderate.     "no tears shed yet"  Objective: Vital signs in last 24 hours:   Patient Vitals for the past 24 hrs:  BP Temp Temp src Pulse Resp SpO2  04/22/15 0537 105/66 mmHg 98 F (36.7 C) - 81 15 99 %  04/22/15 0057 (!) 96/57 mmHg 98.2 F (36.8 C) - 82 15 96 %  04/21/15 1935 114/67 mmHg 98.5 F (36.9 C) - 87 15 95 %  04/21/15 1224 102/69 mmHg 97.5 F (36.4 C) Oral 65 15 100 %  04/21/15 1205 - 97.1 F (36.2 C) - - - -  04/21/15 1153 105/71 mmHg - - 66 13 100 %  04/21/15 1138 99/70 mmHg - - 65 17 100 %  04/21/15 1123 106/66 mmHg - - 67 17 100 %  04/21/15 1108 (!) 101/58 mmHg - - 69 15 100 %  04/21/15 1053 106/68 mmHg - - 64 14 100 %  04/21/15 1038 107/74 mmHg - - 62 14 100 %  04/21/15 1030 - - - 64 12 100 %  04/21/15 1023 (!) 107/59 mmHg - - 70 12 100 %  04/21/15 1015 - - - 63 11 100 %  04/21/15 1008 106/77 mmHg - - 62 13 100 %  04/21/15 1000 - - - 66 16 99 %  04/21/15 0953 (!) 108/58 mmHg - - 65 15 100 %  04/21/15 0945 (!) 108/58 mmHg - - 72 11 98 %  04/21/15 0940 (!) 104/57 mmHg 97.6 F (36.4 C) - 74 16 95 %  04/21/15 0938 (!) 104/57 mmHg - - 72 20 92 %      Intake/Output from previous day:   05/24 0701 - 05/25 0700 In: 1900 [P.O.:450; I.V.:1400] Out: 3095 [Urine:2725; Drains:220]   Intake/Output this shift:       Intake/Output      05/24 0701 - 05/25 0700 05/25 0701 - 05/26 0700   P.O. 450    I.V. (mL/kg) 1400  (15.6)    Other 50    Total Intake(mL/kg) 1900 (21.2)    Urine (mL/kg/hr) 2725 (1.3)    Drains 220 (0.1)    Blood 150 (0.1)    Total Output 3095     Net -1195             LABORATORY DATA:  Recent Labs  04/22/15 0457  WBC 9.1  HGB 11.8*  HCT 35.0*  PLT 180    Recent Labs  04/22/15 0457  NA 136  K 3.8  CL 101  CO2 28  BUN 7  CREATININE 0.80  GLUCOSE 112*  CALCIUM 8.4*   Lab Results  Component Value Date   INR 0.97 04/09/2015  INR 0.91 10/18/2013    Recent Radiographic Studies :  Dg Chest 2 View  04/09/2015   CLINICAL DATA:  Preoperative chest x-ray. 59 year old female for right total knee arthroplasty. Patient is an active smoker.  EXAM: CHEST  2 VIEW  COMPARISON:  Prior chest x-ray 10/18/2013  FINDINGS: The lungs are clear and negative for focal airspace consolidation, pulmonary edema or suspicious pulmonary nodule. Stable mild bronchitic change and interstitial prominence. No pleural effusion or pneumothorax. Cardiac and mediastinal contours are within normal limits. No acute fracture or lytic or blastic osseous lesions. The visualized upper abdominal bowel gas pattern is unremarkable.  IMPRESSION: No active cardiopulmonary disease.   Electronically Signed   By: Jacqulynn Cadet M.D.   On: 04/09/2015 15:52     Examination:  General appearance: alert, cooperative, mild distress and moderate distress Resp: clear to auscultation bilaterally Cardio: regular rate and rhythm GI: normal findings: bowel sounds normal  Wound Exam: clean, dry, intact dressing  Drainage:  None:   Motor Exam: EHL, FHL, Anterior Tibial and Posterior Tibial Intact  Sensory Exam: Superficial Peroneal, Deep Peroneal and Tibial normal  Vascular Exam: Right dorsalis pedis artery has 1+ (weak) pulse  Assessment:    1 Day Post-Op  Procedure(s) (LRB): TOTAL KNEE ARTHROPLASTY (Right)  ADDITIONAL DIAGNOSIS:  Principal Problem:   Primary osteoarthritis of right knee Active Problems:    Primary osteoarthritis of knee   Plan: Physical Therapy as ordered Partial Weight Bearing @ 50% (PWB)  DVT Prophylaxis:  Xarelto, Foot Pumps and TED hose  DISCHARGE PLAN: Home  DISCHARGE NEEDS: HHPT, CPM, Walker and 3-in-1 comode seat   Saline lock IV  Drain pulled        PETRARCA,BRIAN  04/22/2015 8:00 AM

## 2015-04-22 NOTE — Progress Notes (Signed)
Physical Therapy Treatment Patient Details Name: Rebecca Huff MRN: [id number] DOB: 12-28-1955 Today's Date: 04/22/2015    History of Present Illness Pt is a 59 y/o F s/p R TKA.  Pt's PMH includes L TKA, anxiety, and depression.    PT Comments    Pt ambulated 75 ft w/ min guard before requesting to sit 2/2 fatigue in BUEs.  Pt requires verbal cues during ambulation to achieve R foot flat during initial contact.  Pt continues to report severe pain (9/10) in R knee.  Pt will benefit from continued skilled PT services to increase functional independence and safety.   Follow Up Recommendations  Home health PT;Supervision for mobility/OOB     Equipment Recommendations  None recommended by PT    Recommendations for Other Services       Precautions / Restrictions Precautions Precautions: Knee;Fall Precaution Comments: Reviewed no pillow under knee Restrictions Weight Bearing Restrictions: Yes RLE Weight Bearing: Partial weight bearing RLE Partial Weight Bearing Percentage or Pounds: 50    Mobility  Bed Mobility Overal bed mobility: Needs Assistance Bed Mobility: Sit to Supine       Sit to supine: Min assist   General bed mobility comments: Pt attempted to use long scarf and leg hook technique w/o success 2/2 pain in R knee.  Min assist w/ managing RLE into bed.  Pt able to perform scooting toward [facility] using B rails and performing single leg bridge.  Transfers Overall transfer level: Needs assistance Equipment used: Rolling walker (2 wheeled) Transfers: Sit to/from Stand Sit to Stand: Supervision         General transfer comment: Supervision for safety.  Pt demonstrates good understanding of technique.  Ambulation/Gait Ambulation/Gait assistance: Min guard Ambulation Distance (Feet): 75 Feet Assistive device: Rolling walker (2 wheeled) Gait Pattern/deviations: Step-to pattern;Decreased stride length;Decreased weight shift to right;Decreased stance time -  right;Antalgic;Trunk flexed   Gait velocity interpretation: Below normal speed for age/gender General Gait Details: Inc WB through [place], cues to place R foot flat on floor during IC which pt was able to demonstrate.  Pt requested to sit after ambulating 75 ft 2/2 fatigue of BUEs.   Stairs Stairs: Yes Stairs assistance: Min guard Stair Management: No rails;Backwards;With walker Number of Stairs: 1 (x3) General stair comments: Demonstration and verbal cues for technique.    Wheelchair Mobility    Modified Rankin (Stroke Patients Only)       Balance Overall balance assessment: Needs assistance Sitting-balance support: No upper extremity supported;Feet supported Sitting balance-Leahy Scale: Fair     Standing balance support: During functional activity;No upper extremity supported Standing balance-Leahy Scale: Fair Standing balance comment: Pt able to stand at sink to wash hands w/o either UE supported                    Cognition Arousal/Alertness: Awake/alert Behavior During Therapy: WFL for tasks assessed/performed Overall Cognitive Status: Within Functional Limits for tasks assessed                      Exercises Total Joint Exercises Heel Slides: AROM;Right;5 reps;Supine;AAROM Long Arc Quad: AROM;Right;5 reps;Seated Knee Flexion: AROM;Right;5 reps;Seated Goniometric ROM: 4-96    General Comments General comments (skin integrity, edema, etc.): Pt continues to report severe pain (9/10) in R knee.      Pertinent Vitals/Pain Pain Assessment: 0-10 Pain Score: 9  Pain Location: R knee Pain Descriptors / Indicators: Throbbing Pain Intervention(s): Limited activity within patient's tolerance;Monitored during session;Repositioned;Premedicated before session;Ice applied  Home Living                      Prior Function            PT Goals (current goals can now be found in the care plan section) Acute Rehab PT Goals Patient Stated Goal: to  have pain decrease Progress towards PT goals: Progressing toward goals    Frequency  7X/week    PT Plan Current plan remains appropriate    Co-evaluation             End of Session Equipment Utilized During Treatment: Gait belt Activity Tolerance: Patient limited by fatigue;Patient limited by pain Patient left: in bed;in CPM;with call bell/phone within reach;with family/visitor present     Time: 6283-6629 PT Time Calculation (min) (ACUTE ONLY): 23 min  Charges:  $Gait Training: 23-37 mins $Therapeutic Exercise: 8-22 mins                    G Codes:      Joslyn Hy PT, [place] [phone] Pager: [phone] 04/22/2015, 3:10 PM

## 2015-04-22 NOTE — Discharge Instructions (Signed)

## 2015-04-22 NOTE — Care Management Note (Signed)
Case Management Note  Patient Details  Name: Katryna Tschirhart MRN: [id number] Date of Birth: March 23, 1956  Subjective/Objective:       S/p right TKA             Action/Plan:  Spoke with patient about home health, she selected [facility]. Contacted Miranda at [place] and set up  [place]. Theodosia Paling the address in [place] where patient will be staying with her daughter. Patient already has rolling walker and 3N1. [employer] will be providing CPM.   Expected Discharge Date:                  Expected Discharge Plan:  [place]  In-House Referral:     Discharge planning Services  CM Consult  Post Acute Care Choice:  Durable Medical Equipment, Home Health Choice offered to:  Patient  DME Arranged:  CPM DME Agency:  [employer]  HH Arranged:  PT [place] Agency:  [facility]  Status of Service:     Medicare Important Message Given:    Date Medicare IM Given:    Medicare IM give by:    Date Additional Medicare IM Given:    Additional Medicare Important Message give by:     If discussed at [place] of Stay Meetings, dates discussed:    Additional Comments:  Nila Nephew, RN 04/22/2015, 3:15 PM

## 2015-04-23 ENCOUNTER — Inpatient Hospital Stay (HOSPITAL_COMMUNITY): Payer: BLUE CROSS/BLUE SHIELD

## 2015-04-23 ENCOUNTER — Encounter (HOSPITAL_COMMUNITY): Payer: Self-pay | Admitting: General Practice

## 2015-04-23 LAB — URINALYSIS, ROUTINE W REFLEX MICROSCOPIC
Bilirubin Urine: NEGATIVE
Glucose, UA: NEGATIVE mg/dL
Ketones, ur: NEGATIVE mg/dL
LEUKOCYTES UA: NEGATIVE
Nitrite: NEGATIVE
PH: 5.5 (ref 5.0–8.0)
Protein, ur: NEGATIVE mg/dL
UROBILINOGEN UA: 0.2 mg/dL (ref 0.0–1.0)

## 2015-04-23 LAB — BASIC METABOLIC PANEL
Anion gap: 5 (ref 5–15)
BUN: <5 mg/dL — ABNORMAL LOW (ref 6–20)
CO2: 29 mmol/L (ref 22–32)
Calcium: 8.6 mg/dL — ABNORMAL LOW (ref 8.9–10.3)
Chloride: 100 mmol/L — ABNORMAL LOW (ref 101–111)
Creatinine, Ser: 0.67 mg/dL (ref 0.44–1.00)
GFR calc Af Amer: >60 mL/min (ref >60)
GFR calc non Af Amer: >60 mL/min (ref >60)
Glucose, Bld: 151 mg/dL — ABNORMAL HIGH (ref 65–99)
Potassium: 4.2 mmol/L (ref 3.5–5.1)
Sodium: 134 mmol/L — ABNORMAL LOW (ref 135–145)

## 2015-04-23 LAB — URINE MICROSCOPIC-ADD ON

## 2015-04-23 LAB — CBC
HCT: 34.6 % — ABNORMAL LOW (ref 36.0–46.0)
Hemoglobin: 11.7 g/dL — ABNORMAL LOW (ref 12.0–15.0)
MCH: 30.8 pg (ref 26.0–34.0)
MCHC: 33.8 g/dL (ref 30.0–36.0)
MCV: 91.1 fL (ref 78.0–100.0)
Platelets: 193 10*3/uL (ref 150–400)
RBC: 3.8 MIL/uL — ABNORMAL LOW (ref 3.87–5.11)
RDW: 12.3 % (ref 11.5–15.5)
WBC: 10.7 10*3/uL — AB (ref 4.0–10.5)

## 2015-04-23 MED ORDER — METHOCARBAMOL 500 MG PO TABS: 500.0000 mg | ORAL_TABLET | Freq: Four times a day (QID) | ORAL | Status: DC | PRN

## 2015-04-23 MED ORDER — ACETAMINOPHEN 325 MG PO TABS
650.0000 mg | ORAL_TABLET | Freq: Four times a day (QID) | ORAL | Status: DC | PRN
  Administered 2015-04-23: 650 mg via ORAL
  Filled 2015-04-23: qty 2

## 2015-04-23 MED ORDER — RIVAROXABAN 10 MG PO TABS: 10.0000 mg | ORAL_TABLET | Freq: Every day | ORAL | Status: DC

## 2015-04-23 MED ORDER — OXYCODONE HCL 5 MG PO TABS: 5.0000 mg | ORAL_TABLET | ORAL | Status: DC | PRN

## 2015-04-23 NOTE — Progress Notes (Signed)
Physical Therapy Treatment Patient Details Name: Rebecca Huff MRN: [id number] DOB: Apr 13, 1956 Today's Date: 04/23/2015    History of Present Illness Pt is a 60 y/o F s/p R TKA.  Pt's PMH includes L TKA, anxiety, and depression.    PT Comments    Patient continues to make steady progress. Recalls helpful technique and "tricks" from previous knee surgery. Will see again later this afternoon prior to DC .   Follow Up Recommendations  Home health PT;Supervision for mobility/OOB     Equipment Recommendations  None recommended by PT    Recommendations for Other Services       Precautions / Restrictions Precautions Precautions: Knee;Fall Precaution Comments: Reviewed no pillow under knee Restrictions Weight Bearing Restrictions: Yes RLE Weight Bearing: Partial weight bearing RLE Partial Weight Bearing Percentage or Pounds: 50    Mobility  Bed Mobility           Sit to supine: Modified independent (Device/Increase time)   General bed mobility comments: Patient able to lift R LE into bed with leg lifter  Transfers Overall transfer level: Needs assistance Equipment used: Rolling walker (2 wheeled)   Sit to Stand: Supervision         General transfer comment: Supervision for safety.  Pt demonstrates good understanding of technique.  Ambulation/Gait Ambulation/Gait assistance: Supervision Ambulation Distance (Feet): 90 Feet Assistive device: Rolling walker (2 wheeled)   Gait velocity: guarded Gait velocity interpretation: Below normal speed for age/gender General Gait Details: Cues to heel strike with R leg. Over safe technique and use of RW   Stairs            Wheelchair Mobility    Modified Rankin (Stroke Patients Only)       Balance                                    Cognition Arousal/Alertness: Awake/alert Behavior During Therapy: WFL for tasks assessed/performed Overall Cognitive Status: Within Functional Limits for tasks  assessed                      Exercises Total Joint Exercises Quad Sets: AROM;Both;Supine Heel Slides: Right;Supine;AAROM;10 reps Straight Leg Raises: AAROM;Right;10 reps;Supine    General Comments        Pertinent Vitals/Pain Pain Score: 5  Pain Location: R knee Pain Descriptors / Indicators: Sore Pain Intervention(s): Monitored during session;Repositioned    Home Living                      Prior Function            PT Goals (current goals can now be found in the care plan section) Progress towards PT goals: Progressing toward goals    Frequency  7X/week    PT Plan Current plan remains appropriate    Co-evaluation             End of Session Equipment Utilized During Treatment: Gait belt Activity Tolerance: Patient tolerated treatment well Patient left: in bed;with call bell/phone within reach     Time: 0926-0951 PT Time Calculation (min) (ACUTE ONLY): 25 min  Charges:  $Gait Training: 8-22 mins $Therapeutic Exercise: 8-22 mins                    G Codes:      Jacqualyn Posey 04/23/2015, 10:10 AM 04/23/2015 Jacqualyn Posey PTA [phone] pager [phone]  office

## 2015-04-23 NOTE — Discharge Summary (Signed)
[name], MD   Biagio Borg, PA-C [address], [place], [place]  [address]                             [phone]  PATIENT ID: Rebecca Huff        MRN:  [id number]          DOB/AGE: 1956/01/17 / 59 y.o.    DISCHARGE SUMMARY  ADMISSION DATE:    04/21/2015 DISCHARGE DATE:   04/23/2015   ADMISSION DIAGNOSIS: END STAGE OSTEOARTHRITIS OF RIGHT KNEE    DISCHARGE DIAGNOSIS:  END STAGE OSTEOARTHRITIS OF RIGHT KNEE    ADDITIONAL DIAGNOSIS: Principal Problem:   Primary osteoarthritis of right knee Active Problems:   Primary osteoarthritis of knee  Past Medical History  Diagnosis Date  . Arthritis   . Anxiety   . Depression   . GERD (gastroesophageal reflux disease)     every now and then and then she takes tums  . Cancer     skin cancer on anterior left leg    PROCEDURE: Procedure(s): TOTAL KNEE ARTHROPLASTY Right on 04/21/2015  CONSULTS: none     HISTORY: Rebecca Huff is a very pleasant, 59 year old, white female who is seen today for evaluation of her right knee. She has been noted to have osteoarthritis on prior films and some near collapse of the medial compartment with large osteophytes. She is finding herself having more difficulty with walking, and it is unfortunately to the point where she has had increasing symptoms and difficulty with her activities of daily living. She had an injection of cortisone on December 01, 2014 which really did not make much of a difference. An MRI scan was ordered which revealed a progressive full thickness radial tear involving the posterior horn of the medial meniscus with detachment from the meniscal root and medial protrusion of the meniscus. She has progressive medial compartment changes with a full thickness area of cartilage loss and subchondral cystic changes. There are intact ligamentous structures otherwise. She also had a moderate size joint effusion with synovitis. She has gone through 2 arthroscopies previously on her  other knee and viscosupplementation before a total knee replacement. She is to the point now where she does not want to considertemporary conservative measures because of continued pain and discomfort after her last ones. Therefore, she comes in today for a discussion of a possible total joint replacement.  HOSPITAL COURSE:  Shakeia Krus is a 59 y.o. admitted on 04/21/2015 and found to have a diagnosis of OSTEOARTHRITIS OF RIGHT KNEE.  After appropriate laboratory studies were obtained  they were taken to the operating room on 04/21/2015 and underwent  Procedure(s): TOTAL KNEE ARTHROPLASTY  Right.   They were given perioperative antibiotics:  Anti-infectives    Start     Dose/Rate Route Frequency Ordered Stop   04/21/15 1230  ceFAZolin (ANCEF) IVPB 2 g/50 mL premix     2 g 100 mL/hr over 30 Minutes Intravenous Every 6 hours 04/21/15 1228 04/22/15 0029   04/21/15 0550  ceFAZolin (ANCEF) 2-3 GM-% IVPB SOLR    Comments:  Starleen Arms   : cabinet override      04/21/15 0550 04/21/15 0732   04/21/15 0544  ceFAZolin (ANCEF) IVPB 2 g/50 mL premix  Status:  Discontinued     2 g 100 mL/hr over 30 Minutes Intravenous On call to O.R. 04/21/15 4481 04/21/15 1216    .  Tolerated the procedure well.  Placed with a foley intraoperatively.    Toradol was given post op.  POD #1, allowed out of bed to a chair.  PT for ambulation and exercise program.  Foley D/C'd in morning.  IV saline locked.  O2 discontionued. Hemovac pulled.  POD #2, continued PT and ambulation.    .  The remainder of the hospital course was dedicated to ambulation and strengthening.   The patient was discharged on 2 Days Post-Op in  Stable condition.  Blood products given:none  DIAGNOSTIC STUDIES: Recent vital signs: Patient Vitals for the past 24 hrs:  BP Temp Temp src Pulse Resp SpO2  04/23/15 1340 120/69 mmHg 98.8 F (37.1 C) Oral 91 18 97 %  04/23/15 0930 113/71 mmHg 98.1 F (36.7 C) Oral 83 16 94 %  04/23/15 0554  128/67 mmHg (!) 101.3 F (38.5 C) Oral 93 18 93 %  04/22/15 1919 (!) 141/71 mmHg 98.8 F (37.1 C) Oral 92 18 91 %       Recent laboratory studies:  Recent Labs  04/22/15 0457 04/23/15 0505  WBC 9.1 10.7*  HGB 11.8* 11.7*  HCT 35.0* 34.6*  PLT 180 193    Recent Labs  04/22/15 0457 04/23/15 0505  NA 136 134*  K 3.8 4.2  CL 101 100*  CO2 28 29  BUN 7 <5*  CREATININE 0.80 0.67  GLUCOSE 112* 151*  CALCIUM 8.4* 8.6*   Lab Results  Component Value Date   INR 0.97 04/09/2015   INR 0.91 10/18/2013     Recent Radiographic Studies :  Dg Chest 2 View  04/23/2015   CLINICAL DATA:  59 year old female with fever for 1 day.  EXAM: CHEST  2 VIEW  COMPARISON:  04/09/2015 and 10/18/2013 radiographs  FINDINGS: The cardiomediastinal silhouette is unremarkable.  Mild elevation of the right hemidiaphragm again noted.  There is no evidence of focal airspace disease, pulmonary edema, suspicious pulmonary nodule/mass, pleural effusion, or pneumothorax. No acute bony abnormalities are identified.  IMPRESSION: No active cardiopulmonary disease.   Electronically Signed   By: Margarette Canada M.D.   On: 04/23/2015 12:08   Dg Chest 2 View  04/09/2015   CLINICAL DATA:  Preoperative chest x-ray. 59 year old female for right total knee arthroplasty. Patient is an active smoker.  EXAM: CHEST  2 VIEW  COMPARISON:  Prior chest x-ray 10/18/2013  FINDINGS: The lungs are clear and negative for focal airspace consolidation, pulmonary edema or suspicious pulmonary nodule. Stable mild bronchitic change and interstitial prominence. No pleural effusion or pneumothorax. Cardiac and mediastinal contours are within normal limits. No acute fracture or lytic or blastic osseous lesions. The visualized upper abdominal bowel gas pattern is unremarkable.  IMPRESSION: No active cardiopulmonary disease.   Electronically Signed   By: Jacqulynn Cadet M.D.   On: 04/09/2015 15:52    DISCHARGE INSTRUCTIONS: Discharge Instructions     CPM    Complete by:  As directed   Continuous passive motion machine (CPM):      Use the CPM from 0 to 60 for 6-8 hours per day.      You may increase by 5-10 degrees per day.  You may break it up into 2 or 3 sessions per day.      Use CPM for 3-4  weeks or until you are told to stop.     Call MD / Call 911    Complete by:  As directed   If you experience chest pain or shortness of breath, CALL 911 and be  transported to the hospital emergency room.  If you develope a fever above 101 F, pus (white drainage) or increased drainage or redness at the wound, or calf pain, call your surgeon's office.     Change dressing    Complete by:  As directed   DO NOT CHANGE YOUR DRESSING     Constipation Prevention    Complete by:  As directed   Drink plenty of fluids.  Prune juice may be helpful.  You may use a stool softener, such as Colace (over the counter) 100 mg twice a day.  Use MiraLax (over the counter) for constipation as needed.     Diet general    Complete by:  As directed      Discharge instructions    Complete by:  As directed   [place] items at home which could result in a fall. This includes throw rugs or furniture in walking pathways ICE to the affected joint every three hours while awake for 30 minutes at a time, for at least the first 3-5 days, and then as needed for pain and swelling.  Continue to use ice for pain and swelling. You may notice swelling that will progress down to the foot and ankle.  This is normal after surgery.  Elevate your leg when you are not up walking on it.   Continue to use the breathing machine you got in the hospital (incentive spirometer) which will help keep your temperature down.  It is common for your temperature to cycle up and down following surgery, especially at night when you are not up moving around and exerting yourself.  The breathing machine keeps your lungs expanded and your temperature down.   DIET:  As you  were doing prior to hospitalization, we recommend a well-balanced diet.  DRESSING / WOUND CARE / SHOWERING  Keep the surgical dressing until follow up.  The dressing is water proof, so you can shower without any extra covering.  IF THE DRESSING FALLS OFF or the wound gets wet inside, change the dressing with sterile gauze.  Please use good hand washing techniques before changing the dressing.  Do not use any lotions or creams on the incision until instructed by your surgeon.    ACTIVITY  Increase activity slowly as tolerated, but follow the weight bearing instructions below.   No driving for 6 weeks or until further direction given by your physician.  You cannot drive while taking narcotics.  No lifting or carrying greater than 10 lbs. until further directed by your surgeon. Avoid periods of inactivity such as sitting longer than an hour when not asleep. This helps prevent blood clots.  You may return to work once you are authorized by your doctor.     WEIGHT BEARING   Partial weight bearing with assist device as directed.  50%   EXERCISES  Results after joint replacement surgery are often greatly improved when you follow the exercise, range of motion and muscle strengthening exercises prescribed by your doctor. Safety measures are also important to protect the joint from further injury. Any time any of these exercises cause you to have increased pain or swelling, decrease what you are doing until you are comfortable again and then slowly increase them. If you have problems or questions, call your caregiver or physical therapist for advice.   Rehabilitation is important following a joint replacement. After just a few days of immobilization, the muscles of the leg can become weakened and  shrink (atrophy).  These exercises are designed to build up the tone and strength of the thigh and leg muscles and to improve motion. Often times heat used for twenty to thirty minutes before working out will  loosen up your tissues and help with improving the range of motion but do not use heat for the first two weeks following surgery (sometimes heat can increase post-operative swelling).   These exercises can be done on a training (exercise) mat on a table or on a bed. Use whatever works the best and is most comfortable for you.    Use music or television while you are exercising so that the exercises are a pleasant break in your day. This will make your life better with the exercises acting as a break in your routine that you can look forward to.   Perform all exercises about fifteen times, three times per day or as directed.  You should exercise both the operative leg and the other leg as well.   Exercises include:   Quad Sets - Tighten up the muscle on the front of the thigh (Quad) and hold for 5-10 seconds.   Straight Leg Raises - With your knee straight (if you were given a brace, keep it on), lift the leg to 60 degrees, hold for 3 seconds, and slowly lower the leg.  Perform this exercise against resistance later as your leg gets stronger.  Leg Slides: Lying on your back, slowly slide your foot toward your buttocks, bending your knee up off the floor (only go as far as is comfortable). Then slowly slide your foot back down until your leg is flat on the floor again.  Angel Wings: Lying on your back spread your legs to the side as far apart as you can without causing discomfort.  Hamstring Strength:  Lying on your back, push your heel against the floor with your leg straight by tightening up the muscles of your buttocks.  Repeat, but this time bend your knee to a comfortable angle, and push your heel against the floor.  You may put a pillow under the heel to make it more comfortable if necessary.   A rehabilitation program following joint replacement surgery can speed recovery and prevent re-injury in the future due to weakened muscles. Contact your doctor or a physical therapist for more information on  knee rehabilitation.    CONSTIPATION  Constipation is defined medically as fewer than three stools per week and severe constipation as less than one stool per week.  Even if you have a regular bowel pattern at home, your normal regimen is likely to be disrupted due to multiple reasons following surgery.  Combination of anesthesia, postoperative narcotics, change in appetite and fluid intake all can affect your bowels.   YOU MUST use at least one of the following options; they are listed in order of increasing strength to get the job done.  They are all available over the counter, and you may need to use some, POSSIBLY even all of these options:    Drink plenty of fluids (prune juice may be helpful) and high fiber foods Colace 100 mg by mouth twice a day  Senokot for constipation as directed and as needed Dulcolax (bisacodyl), take with full glass of water  Miralax (polyethylene glycol) once or twice a day as needed.  If you have tried all these things and are unable to have a bowel movement in the first 3-4 days after surgery call either your surgeon or your  primary doctor.    If you experience loose stools or diarrhea, hold the medications until you stool forms back up.  If your symptoms do not get better within 1 week or if they get worse, check with your doctor.  If you experience "the worst abdominal pain ever" or develop nausea or vomiting, please contact the office immediately for further recommendations for treatment.   ITCHING:  If you experience itching with your medications, try taking only a single pain pill, or even half a pain pill at a time.  You can also use Benadryl over the counter for itching or also to help with sleep.   TED HOSE STOCKINGS:  Use stockings on both legs until for at least 2 weeks or as directed by physician office. They may be removed at night for sleeping.  MEDICATIONS:  See your medication summary on the "After Visit Summary" that nursing will review with  you.  You may have some home medications which will be placed on hold until you complete the course of blood thinner medication.  It is important for you to complete the blood thinner medication as prescribed.  PRECAUTIONS:  If you experience chest pain or shortness of breath - call 911 immediately for transfer to the hospital emergency department.   If you develop a fever greater that 101 F, purulent drainage from wound, increased redness or drainage from wound, foul odor from the wound/dressing, or calf pain - CONTACT YOUR SURGEON.                                                   FOLLOW-UP APPOINTMENTS:  If you do not already have a post-op appointment, please call the office for an appointment to be seen by your surgeon.  Guidelines for how soon to be seen are listed in your "After Visit Summary", but are typically between 1-4 weeks after surgery.  OTHER INSTRUCTIONS:   Knee Replacement:  Do not place pillow under knee, focus on keeping the knee straight while resting. CPM instructions: 0-90 degrees, 2 hours in the morning, 2 hours in the afternoon, and 2 hours in the evening. Place foam block, curve side up under heel at all times except when in CPM or when walking.  DO NOT modify, tear, cut, or change the foam block in any way.  MAKE SURE YOU:  Understand these instructions.  Get help right away if you are not doing well or get worse.    Thank you for letting us be a part of your medical care team.  It is a privilege we respect greatly.  We hope these instructions will help you stay on track for a fast and full recovery!     Do not put a pillow under the knee. Place it under the heel.    Complete by:  As directed      Driving restrictions    Complete by:  As directed   No driving for 6 weeks     Increase activity slowly as tolerated    Complete by:  As directed      Lifting restrictions    Complete by:  As directed   No lifting for 6 weeks     Partial weight bearing    Complete by:   As directed   % Body Weight:  50%  Laterality:  right  Extremity:  Lower     Patient may shower    Complete by:  As directed   You may shower over the brown dressing     TED hose    Complete by:  As directed   Use stockings (TED hose) for 2-3 weeks on right leg.  You may remove them at night for sleeping.           DISCHARGE MEDICATIONS:     Medication List    STOP taking these medications        meloxicam 15 MG tablet  Commonly known as:  MOBIC      TAKE these medications        LORazepam 0.5 MG tablet  Commonly known as:  ATIVAN  Take 0.75 mg by mouth at bedtime as needed for sleep.     methocarbamol 500 MG tablet  Commonly known as:  ROBAXIN  Take 1 tablet (500 mg total) by mouth every 6 (six) hours as needed for muscle spasms.     oxyCODONE 5 MG immediate release tablet  Commonly known as:  Oxy IR/ROXICODONE  Take 1-2 tablets (5-10 mg total) by mouth every 4 (four) hours as needed for breakthrough pain.     rivaroxaban 10 MG Tabs tablet  Commonly known as:  XARELTO  Take 1 tablet (10 mg total) by mouth daily with breakfast.     sertraline 50 MG tablet  Commonly known as:  ZOLOFT  Take 50 mg by mouth daily.        FOLLOW UP VISIT:       Follow-up Information    Follow up with [facility].   Why:  They will contact you to schedule home therapy visits.   Contact information:   [address] [place] [address] [phone]       Follow up with Garald Balding, MD. Schedule an appointment as soon as possible for a visit on 05/04/2015.   Specialty:  Orthopedic Surgery   Contact information:   [place] [place] [address] [phone]       DISPOSITION:   Home  CONDITION:  Stable   Mike Craze. [place], [place] [phone]  04/23/2015 3:49 PM

## 2015-04-23 NOTE — Progress Notes (Signed)
D/C teaching and Rx's reviewed with patient by CN. Prepared for d/c, taken by w/c with family and belongings per nursing tech to front of N tower for d/c home.

## 2015-04-23 NOTE — Progress Notes (Signed)
Physical Therapy Treatment Patient Details Name: Rebecca Huff MRN: [id number] DOB: 1956/03/26 Today's Date: 04/23/2015    History of Present Illness Pt is a 59 y/o F s/p R TKA.  Pt's PMH includes L TKA, anxiety, and depression.    PT Comments    Patient continues to make good progress with mobility. Discussed car transfer. Daughter in room throughout sessions today and will be with her until Sunday. Patient safe to D/C from a mobility standpoint based on progression towards goals set on PT eval.    Follow Up Recommendations  Home health PT;Supervision for mobility/OOB     Equipment Recommendations  None recommended by PT    Recommendations for Other Services       Precautions / Restrictions Precautions Precautions: Knee;Fall Precaution Comments: Reviewed no pillow under knee Restrictions RLE Weight Bearing: Partial weight bearing RLE Partial Weight Bearing Percentage or Pounds: 50    Mobility  Bed Mobility Overal bed mobility: Modified Independent                Transfers Overall transfer level: Modified independent                  Ambulation/Gait Ambulation/Gait assistance: Supervision Ambulation Distance (Feet): 100 Feet Assistive device: Rolling walker (2 wheeled) Gait Pattern/deviations: Step-through pattern;Decreased stride length Gait velocity: guarded Gait velocity interpretation: Below normal speed for age/gender General Gait Details:  Over safe technique and use of RW   Stairs         General stair comments: Deferred any further practice of steps  Wheelchair Mobility    Modified Rankin (Stroke Patients Only)       Balance                                    Cognition Arousal/Alertness: Awake/alert Behavior During Therapy: WFL for tasks assessed/performed Overall Cognitive Status: Within Functional Limits for tasks assessed                      Exercises      General Comments         Pertinent Vitals/Pain Pain Score: 4  Pain Location: R knee Pain Descriptors / Indicators: Aching;Sore Pain Intervention(s): Monitored during session    Home Living Family/patient expects to be discharged to:: Private residence Living Arrangements: Alone                  Prior Function            PT Goals (current goals can now be found in the care plan section) Progress towards PT goals: Progressing toward goals    Frequency  7X/week    PT Plan Current plan remains appropriate    Co-evaluation             End of Session   Activity Tolerance: Patient tolerated treatment well Patient left: in chair;with call bell/phone within reach     Time: 1347-1402 PT Time Calculation (min) (ACUTE ONLY): 15 min  Charges:  $Gait Training: 8-22 mins                    G Codes:      Jacqualyn Posey 04/23/2015, 2:13 PM 04/23/2015 Jacqualyn Posey PTA [phone] pager [phone] office

## 2015-04-23 NOTE — Progress Notes (Signed)
Subjective: 2 Days Post-Op Procedure(s) (LRB): TOTAL KNEE ARTHROPLASTY (Right) Patient reports pain as mild.    Objective: Vital signs in last 24 hours: Temp:  [98.1 F (36.7 C)-101.3 F (38.5 C)] 98.8 F (37.1 C) (05/26 1340) Pulse Rate:  [83-93] 91 (05/26 1340) Resp:  [16-18] 18 (05/26 1340) BP: (113-141)/(67-71) 120/69 mmHg (05/26 1340) SpO2:  [91 %-97 %] 97 % (05/26 1340)  Intake/Output from previous day: 05/25 0701 - 05/26 0700 In: 480 [P.O.:480] Out: -  Intake/Output this shift:     Recent Labs  04/22/15 0457 04/23/15 0505  HGB 11.8* 11.7*    Recent Labs  04/22/15 0457 04/23/15 0505  WBC 9.1 10.7*  RBC 3.78* 3.80*  HCT 35.0* 34.6*  PLT 180 193    Recent Labs  04/22/15 0457 04/23/15 0505  NA 136 134*  K 3.8 4.2  CL 101 100*  CO2 28 29  BUN 7 <5*  CREATININE 0.80 0.67  GLUCOSE 112* 151*  CALCIUM 8.4* 8.6*   No results for input(s): LABPT, INR in the last 72 hours.CXR neg this afternoon-afebrile, denies SOB or chest pain, no calf pain  Neurologically intact Neurovascular intact  Assessment/Plan: 2 Days Post-Op Procedure(s) (LRB): TOTAL KNEE ARTHROPLASTY (Right) Up with therapy Discharge home with home health Dressing changed right knee-wound clean and dry Shanay Woolman W 04/23/2015, 3:44 PM

## 2015-07-21 ENCOUNTER — Other Ambulatory Visit: Payer: Self-pay

## 2015-07-21 DIAGNOSIS — Z1231 Encounter for screening mammogram for malignant neoplasm of breast: Secondary | ICD-10-CM

## 2015-07-28 ENCOUNTER — Ambulatory Visit
Admission: RE | Admit: 2015-07-28 | Discharge: 2015-07-28 | Disposition: A | Discharge status: Home / Self Care | Payer: BLUE CROSS/BLUE SHIELD | Source: Ambulatory Visit

## 2015-07-28 DIAGNOSIS — Z1231 Encounter for screening mammogram for malignant neoplasm of breast: Secondary | ICD-10-CM

## 2016-05-23 ENCOUNTER — Other Ambulatory Visit: Payer: Self-pay | Admitting: Family Medicine

## 2016-05-23 DIAGNOSIS — R1012 Left upper quadrant pain: Secondary | ICD-10-CM

## 2016-05-24 ENCOUNTER — Ambulatory Visit
Admission: RE | Admit: 2016-05-24 | Discharge: 2016-05-24 | Disposition: A | Discharge status: Home / Self Care | Payer: BLUE CROSS/BLUE SHIELD | Source: Ambulatory Visit | Attending: Family Medicine | Admitting: Family Medicine

## 2016-05-24 DIAGNOSIS — R1012 Left upper quadrant pain: Secondary | ICD-10-CM

## 2016-05-24 MED ORDER — IOPAMIDOL (ISOVUE-300) INJECTION 61%
100.0000 mL | Freq: Once | INTRAVENOUS | Status: AC | PRN
  Administered 2016-05-24: 100 mL via INTRAVENOUS

## 2016-09-22 ENCOUNTER — Other Ambulatory Visit: Payer: Self-pay | Admitting: Family Medicine

## 2016-09-22 DIAGNOSIS — Z1231 Encounter for screening mammogram for malignant neoplasm of breast: Secondary | ICD-10-CM

## 2016-10-03 ENCOUNTER — Ambulatory Visit
Admission: RE | Admit: 2016-10-03 | Discharge: 2016-10-03 | Disposition: A | Discharge status: Home / Self Care | Payer: BLUE CROSS/BLUE SHIELD | Source: Ambulatory Visit | Attending: Family Medicine | Admitting: Family Medicine

## 2016-10-03 DIAGNOSIS — Z1231 Encounter for screening mammogram for malignant neoplasm of breast: Secondary | ICD-10-CM

## 2017-01-09 ENCOUNTER — Ambulatory Visit (INDEPENDENT_AMBULATORY_CARE_PROVIDER_SITE_OTHER): Payer: BLUE CROSS/BLUE SHIELD | Admitting: Orthopaedic Surgery

## 2017-01-09 ENCOUNTER — Encounter (INDEPENDENT_AMBULATORY_CARE_PROVIDER_SITE_OTHER): Payer: Self-pay | Admitting: Orthopaedic Surgery

## 2017-01-09 VITALS — BP 123/83 | HR 92 | Resp 14 | Ht 68.5 in | Wt 195.0 lb

## 2017-01-09 DIAGNOSIS — Z96653 Presence of artificial knee joint, bilateral: Secondary | ICD-10-CM | POA: Diagnosis not present

## 2017-01-09 NOTE — Progress Notes (Signed)
   Office Visit Note   Patient: Rebecca Huff           Date of Birth: 1956/04/09           MRN: [id number] Visit Date: 01/09/2017              Requested by: Harlan Stains, MD [address], [place] [address] PCP: Vidal Schwalbe, MD   Assessment & Plan: Visit Diagnoses: Status post bilateral total knee replacement. Approximately 4 years on the left in 2 years on the right. Doing very well  Plan: Reinforced need for antibiotics with any invasive procedures. Also urged exercises for strengthening of quads   Follow-Up Instructions: No Follow-up on file.   Orders:  No orders of the defined types were placed in this encounter.  No orders of the defined types were placed in this encounter.     Procedures: No procedures performed   Clinical Data: No additional findings.   Subjective: No chief complaint on file.   Status post one year Right TKA. Pt relates she has been wonderful with no pain, edema or redness.  So for years status post left total knee replacement without any problems. Patient denies fever or chills or aches and pains.  Review of Systems   Objective: Vital Signs: There were no vitals taken for this visit.  Physical Exam  Ortho Exam  Specialty Comments:  No specialty comments available.  Imaging: No results found.   PMFS History: Patient Active Problem List   Diagnosis Date Noted  . Primary osteoarthritis of right knee 04/21/2015  . Primary osteoarthritis of knee 04/21/2015  . Osteoarthritis of left knee 10/30/2013  . S/P total knee replacement using cement 10/29/2013   Past Medical History:  Diagnosis Date  . Anxiety   . Arthritis   . Cancer    skin cancer on anterior left leg  . Depression   . GERD (gastroesophageal reflux disease)    every now and then and then she takes tums    No family history on file.  Past Surgical History:  Procedure Laterality Date  . ABDOMINAL HYSTERECTOMY    . CESAREAN SECTION       x 2  . DILATION AND CURETTAGE OF UTERUS    . JOINT REPLACEMENT    . MENISCUS REPAIR     left  knee  . TOTAL KNEE ARTHROPLASTY Left 10/29/2013   Dr [name]  . TOTAL KNEE ARTHROPLASTY Left 10/29/2013   Procedure: LEFT TOTAL KNEE ARTHROPLASTY;  Surgeon: Garald Balding, MD;  Location: [facility];  Service: Orthopedics;  Laterality: Left;  . TOTAL KNEE ARTHROPLASTY Right 04/21/2015   Procedure: TOTAL KNEE ARTHROPLASTY;  Surgeon: Garald Balding, MD;  Location: [facility];  Service: Orthopedics;  Laterality: Right;  . TUBAL LIGATION     Social History   Occupational History  . Not on file.   Social History Main Topics  . Smoking status: Current Every Day Smoker    Packs/day: 0.25    Years: 35.00    Types: Cigarettes  . Smokeless tobacco: Never Used  . Alcohol use 2.4 oz/week    4 Glasses of wine per week     Comment: a couple glasses a week  . Drug use: No  . Sexual activity: Not on file

## 2017-01-20 ENCOUNTER — Ambulatory Visit (INDEPENDENT_AMBULATORY_CARE_PROVIDER_SITE_OTHER): Payer: Self-pay | Admitting: Orthopaedic Surgery

## 2017-04-21 ENCOUNTER — Other Ambulatory Visit: Payer: Self-pay | Admitting: Family Medicine

## 2017-04-21 DIAGNOSIS — F17200 Nicotine dependence, unspecified, uncomplicated: Secondary | ICD-10-CM

## 2017-05-08 ENCOUNTER — Ambulatory Visit
Admission: RE | Admit: 2017-05-08 | Discharge: 2017-05-08 | Disposition: A | Discharge status: Home / Self Care | Payer: BLUE CROSS/BLUE SHIELD | Source: Ambulatory Visit | Attending: Family Medicine | Admitting: Family Medicine

## 2017-05-08 DIAGNOSIS — F17200 Nicotine dependence, unspecified, uncomplicated: Secondary | ICD-10-CM

## 2017-09-29 ENCOUNTER — Other Ambulatory Visit: Payer: Self-pay | Admitting: Family Medicine

## 2017-09-29 DIAGNOSIS — Z1231 Encounter for screening mammogram for malignant neoplasm of breast: Secondary | ICD-10-CM

## 2017-10-26 ENCOUNTER — Ambulatory Visit
Admission: RE | Admit: 2017-10-26 | Discharge: 2017-10-26 | Disposition: A | Discharge status: Home / Self Care | Payer: BLUE CROSS/BLUE SHIELD | Source: Ambulatory Visit | Attending: Family Medicine | Admitting: Family Medicine

## 2017-10-26 DIAGNOSIS — Z1231 Encounter for screening mammogram for malignant neoplasm of breast: Secondary | ICD-10-CM

## 2018-06-14 ENCOUNTER — Other Ambulatory Visit: Payer: Self-pay | Admitting: Family Medicine

## 2018-06-14 DIAGNOSIS — F17201 Nicotine dependence, unspecified, in remission: Secondary | ICD-10-CM

## 2018-06-27 ENCOUNTER — Other Ambulatory Visit: Payer: Self-pay | Admitting: Family Medicine

## 2018-06-27 DIAGNOSIS — F17201 Nicotine dependence, unspecified, in remission: Secondary | ICD-10-CM

## 2018-07-06 ENCOUNTER — Ambulatory Visit: Payer: Self-pay

## 2018-07-17 ENCOUNTER — Inpatient Hospital Stay
Admission: RE | Admit: 2018-07-17 | Discharge: 2018-07-17 | Disposition: A | Discharge status: Home / Self Care | Payer: Self-pay | Source: Ambulatory Visit | Attending: Family Medicine | Admitting: Family Medicine

## 2018-07-17 ENCOUNTER — Ambulatory Visit
Admission: RE | Admit: 2018-07-17 | Discharge: 2018-07-17 | Disposition: A | Discharge status: Home / Self Care | Payer: BLUE CROSS/BLUE SHIELD | Source: Ambulatory Visit | Attending: Family Medicine | Admitting: Family Medicine

## 2018-07-17 DIAGNOSIS — F17201 Nicotine dependence, unspecified, in remission: Secondary | ICD-10-CM

## 2018-10-23 ENCOUNTER — Other Ambulatory Visit: Payer: Self-pay | Admitting: Family Medicine

## 2018-10-23 DIAGNOSIS — Z1231 Encounter for screening mammogram for malignant neoplasm of breast: Secondary | ICD-10-CM

## 2018-12-03 ENCOUNTER — Ambulatory Visit: Payer: BLUE CROSS/BLUE SHIELD

## 2018-12-28 ENCOUNTER — Ambulatory Visit: Payer: Self-pay

## 2019-01-07 DIAGNOSIS — D225 Melanocytic nevi of trunk: Secondary | ICD-10-CM | POA: Diagnosis not present

## 2019-01-07 DIAGNOSIS — L814 Other melanin hyperpigmentation: Secondary | ICD-10-CM | POA: Diagnosis not present

## 2019-01-07 DIAGNOSIS — D485 Neoplasm of uncertain behavior of skin: Secondary | ICD-10-CM | POA: Diagnosis not present

## 2019-01-07 DIAGNOSIS — L570 Actinic keratosis: Secondary | ICD-10-CM | POA: Diagnosis not present

## 2019-01-07 DIAGNOSIS — L821 Other seborrheic keratosis: Secondary | ICD-10-CM | POA: Diagnosis not present

## 2019-01-07 DIAGNOSIS — Z85828 Personal history of other malignant neoplasm of skin: Secondary | ICD-10-CM | POA: Diagnosis not present

## 2019-01-10 ENCOUNTER — Ambulatory Visit
Admission: RE | Admit: 2019-01-10 | Discharge: 2019-01-10 | Disposition: A | Discharge status: Home / Self Care | Payer: BLUE CROSS/BLUE SHIELD | Source: Ambulatory Visit | Attending: Family Medicine | Admitting: Family Medicine

## 2019-01-10 DIAGNOSIS — Z1231 Encounter for screening mammogram for malignant neoplasm of breast: Secondary | ICD-10-CM | POA: Diagnosis not present

## 2019-01-21 DIAGNOSIS — J0110 Acute frontal sinusitis, unspecified: Secondary | ICD-10-CM | POA: Diagnosis not present

## 2019-02-04 DIAGNOSIS — J0110 Acute frontal sinusitis, unspecified: Secondary | ICD-10-CM | POA: Diagnosis not present

## 2019-02-04 DIAGNOSIS — J4521 Mild intermittent asthma with (acute) exacerbation: Secondary | ICD-10-CM | POA: Diagnosis not present

## 2019-02-05 ENCOUNTER — Other Ambulatory Visit: Payer: Self-pay | Admitting: Family Medicine

## 2019-02-05 ENCOUNTER — Ambulatory Visit
Admission: RE | Admit: 2019-02-05 | Discharge: 2019-02-05 | Disposition: A | Discharge status: Home / Self Care | Payer: BLUE CROSS/BLUE SHIELD | Source: Ambulatory Visit | Attending: Family Medicine | Admitting: Family Medicine

## 2019-02-05 DIAGNOSIS — R059 Cough, unspecified: Secondary | ICD-10-CM

## 2019-02-05 DIAGNOSIS — R05 Cough: Secondary | ICD-10-CM

## 2019-02-05 DIAGNOSIS — R062 Wheezing: Secondary | ICD-10-CM

## 2019-05-30 DIAGNOSIS — Z0000 Encounter for general adult medical examination without abnormal findings: Secondary | ICD-10-CM | POA: Diagnosis not present

## 2019-06-05 ENCOUNTER — Other Ambulatory Visit: Payer: Self-pay | Admitting: Family Medicine

## 2019-06-05 DIAGNOSIS — F17201 Nicotine dependence, unspecified, in remission: Secondary | ICD-10-CM

## 2019-06-12 ENCOUNTER — Other Ambulatory Visit: Payer: Self-pay | Admitting: Family Medicine

## 2019-06-12 DIAGNOSIS — M5432 Sciatica, left side: Secondary | ICD-10-CM

## 2019-07-03 ENCOUNTER — Other Ambulatory Visit: Payer: Self-pay | Admitting: Family Medicine

## 2019-07-04 ENCOUNTER — Other Ambulatory Visit: Payer: BLUE CROSS/BLUE SHIELD

## 2019-07-24 ENCOUNTER — Ambulatory Visit
Admission: RE | Admit: 2019-07-24 | Discharge: 2019-07-24 | Disposition: A | Discharge status: Home / Self Care | Payer: BC Managed Care – PPO | Source: Ambulatory Visit | Attending: Family Medicine | Admitting: Family Medicine

## 2019-07-24 DIAGNOSIS — Z87891 Personal history of nicotine dependence: Secondary | ICD-10-CM | POA: Diagnosis not present

## 2019-07-24 DIAGNOSIS — F17201 Nicotine dependence, unspecified, in remission: Secondary | ICD-10-CM

## 2019-12-12 DIAGNOSIS — M9902 Segmental and somatic dysfunction of thoracic region: Secondary | ICD-10-CM | POA: Diagnosis not present

## 2019-12-12 DIAGNOSIS — M9904 Segmental and somatic dysfunction of sacral region: Secondary | ICD-10-CM | POA: Diagnosis not present

## 2019-12-12 DIAGNOSIS — M9903 Segmental and somatic dysfunction of lumbar region: Secondary | ICD-10-CM | POA: Diagnosis not present

## 2019-12-12 DIAGNOSIS — M5417 Radiculopathy, lumbosacral region: Secondary | ICD-10-CM | POA: Diagnosis not present

## 2019-12-13 DIAGNOSIS — M9903 Segmental and somatic dysfunction of lumbar region: Secondary | ICD-10-CM | POA: Diagnosis not present

## 2019-12-13 DIAGNOSIS — M5417 Radiculopathy, lumbosacral region: Secondary | ICD-10-CM | POA: Diagnosis not present

## 2019-12-13 DIAGNOSIS — M9904 Segmental and somatic dysfunction of sacral region: Secondary | ICD-10-CM | POA: Diagnosis not present

## 2019-12-13 DIAGNOSIS — M9902 Segmental and somatic dysfunction of thoracic region: Secondary | ICD-10-CM | POA: Diagnosis not present

## 2019-12-16 DIAGNOSIS — M9903 Segmental and somatic dysfunction of lumbar region: Secondary | ICD-10-CM | POA: Diagnosis not present

## 2019-12-16 DIAGNOSIS — M9904 Segmental and somatic dysfunction of sacral region: Secondary | ICD-10-CM | POA: Diagnosis not present

## 2019-12-16 DIAGNOSIS — M5417 Radiculopathy, lumbosacral region: Secondary | ICD-10-CM | POA: Diagnosis not present

## 2019-12-16 DIAGNOSIS — M9902 Segmental and somatic dysfunction of thoracic region: Secondary | ICD-10-CM | POA: Diagnosis not present

## 2019-12-18 DIAGNOSIS — M5417 Radiculopathy, lumbosacral region: Secondary | ICD-10-CM | POA: Diagnosis not present

## 2019-12-18 DIAGNOSIS — M9903 Segmental and somatic dysfunction of lumbar region: Secondary | ICD-10-CM | POA: Diagnosis not present

## 2019-12-18 DIAGNOSIS — M9902 Segmental and somatic dysfunction of thoracic region: Secondary | ICD-10-CM | POA: Diagnosis not present

## 2019-12-18 DIAGNOSIS — M9904 Segmental and somatic dysfunction of sacral region: Secondary | ICD-10-CM | POA: Diagnosis not present

## 2019-12-20 DIAGNOSIS — M9902 Segmental and somatic dysfunction of thoracic region: Secondary | ICD-10-CM | POA: Diagnosis not present

## 2019-12-20 DIAGNOSIS — M9903 Segmental and somatic dysfunction of lumbar region: Secondary | ICD-10-CM | POA: Diagnosis not present

## 2019-12-20 DIAGNOSIS — M5417 Radiculopathy, lumbosacral region: Secondary | ICD-10-CM | POA: Diagnosis not present

## 2019-12-20 DIAGNOSIS — M9904 Segmental and somatic dysfunction of sacral region: Secondary | ICD-10-CM | POA: Diagnosis not present

## 2019-12-25 DIAGNOSIS — M9902 Segmental and somatic dysfunction of thoracic region: Secondary | ICD-10-CM | POA: Diagnosis not present

## 2019-12-25 DIAGNOSIS — M9903 Segmental and somatic dysfunction of lumbar region: Secondary | ICD-10-CM | POA: Diagnosis not present

## 2019-12-25 DIAGNOSIS — M5417 Radiculopathy, lumbosacral region: Secondary | ICD-10-CM | POA: Diagnosis not present

## 2019-12-25 DIAGNOSIS — M9904 Segmental and somatic dysfunction of sacral region: Secondary | ICD-10-CM | POA: Diagnosis not present

## 2019-12-27 DIAGNOSIS — M9903 Segmental and somatic dysfunction of lumbar region: Secondary | ICD-10-CM | POA: Diagnosis not present

## 2019-12-27 DIAGNOSIS — M9902 Segmental and somatic dysfunction of thoracic region: Secondary | ICD-10-CM | POA: Diagnosis not present

## 2019-12-27 DIAGNOSIS — M9904 Segmental and somatic dysfunction of sacral region: Secondary | ICD-10-CM | POA: Diagnosis not present

## 2019-12-27 DIAGNOSIS — M5417 Radiculopathy, lumbosacral region: Secondary | ICD-10-CM | POA: Diagnosis not present

## 2019-12-30 DIAGNOSIS — M9903 Segmental and somatic dysfunction of lumbar region: Secondary | ICD-10-CM | POA: Diagnosis not present

## 2019-12-30 DIAGNOSIS — M5417 Radiculopathy, lumbosacral region: Secondary | ICD-10-CM | POA: Diagnosis not present

## 2019-12-30 DIAGNOSIS — M9904 Segmental and somatic dysfunction of sacral region: Secondary | ICD-10-CM | POA: Diagnosis not present

## 2019-12-30 DIAGNOSIS — M9902 Segmental and somatic dysfunction of thoracic region: Secondary | ICD-10-CM | POA: Diagnosis not present

## 2020-01-02 DIAGNOSIS — R42 Dizziness and giddiness: Secondary | ICD-10-CM | POA: Diagnosis not present

## 2020-01-02 DIAGNOSIS — M9904 Segmental and somatic dysfunction of sacral region: Secondary | ICD-10-CM | POA: Diagnosis not present

## 2020-01-02 DIAGNOSIS — M9902 Segmental and somatic dysfunction of thoracic region: Secondary | ICD-10-CM | POA: Diagnosis not present

## 2020-01-02 DIAGNOSIS — M5417 Radiculopathy, lumbosacral region: Secondary | ICD-10-CM | POA: Diagnosis not present

## 2020-01-02 DIAGNOSIS — M9903 Segmental and somatic dysfunction of lumbar region: Secondary | ICD-10-CM | POA: Diagnosis not present

## 2020-01-07 ENCOUNTER — Other Ambulatory Visit: Payer: Self-pay | Admitting: Family Medicine

## 2020-01-07 DIAGNOSIS — Z1231 Encounter for screening mammogram for malignant neoplasm of breast: Secondary | ICD-10-CM

## 2020-01-13 DIAGNOSIS — M9903 Segmental and somatic dysfunction of lumbar region: Secondary | ICD-10-CM | POA: Diagnosis not present

## 2020-01-13 DIAGNOSIS — M9902 Segmental and somatic dysfunction of thoracic region: Secondary | ICD-10-CM | POA: Diagnosis not present

## 2020-01-13 DIAGNOSIS — M9904 Segmental and somatic dysfunction of sacral region: Secondary | ICD-10-CM | POA: Diagnosis not present

## 2020-01-13 DIAGNOSIS — M5417 Radiculopathy, lumbosacral region: Secondary | ICD-10-CM | POA: Diagnosis not present

## 2020-01-14 DIAGNOSIS — L814 Other melanin hyperpigmentation: Secondary | ICD-10-CM | POA: Diagnosis not present

## 2020-01-14 DIAGNOSIS — L723 Sebaceous cyst: Secondary | ICD-10-CM | POA: Diagnosis not present

## 2020-01-14 DIAGNOSIS — L821 Other seborrheic keratosis: Secondary | ICD-10-CM | POA: Diagnosis not present

## 2020-01-14 DIAGNOSIS — Z85828 Personal history of other malignant neoplasm of skin: Secondary | ICD-10-CM | POA: Diagnosis not present

## 2020-01-14 DIAGNOSIS — D225 Melanocytic nevi of trunk: Secondary | ICD-10-CM | POA: Diagnosis not present

## 2020-01-14 DIAGNOSIS — L570 Actinic keratosis: Secondary | ICD-10-CM | POA: Diagnosis not present

## 2020-01-20 DIAGNOSIS — M9904 Segmental and somatic dysfunction of sacral region: Secondary | ICD-10-CM | POA: Diagnosis not present

## 2020-01-20 DIAGNOSIS — M5417 Radiculopathy, lumbosacral region: Secondary | ICD-10-CM | POA: Diagnosis not present

## 2020-01-20 DIAGNOSIS — M9903 Segmental and somatic dysfunction of lumbar region: Secondary | ICD-10-CM | POA: Diagnosis not present

## 2020-01-20 DIAGNOSIS — M9902 Segmental and somatic dysfunction of thoracic region: Secondary | ICD-10-CM | POA: Diagnosis not present

## 2020-01-28 DIAGNOSIS — M9903 Segmental and somatic dysfunction of lumbar region: Secondary | ICD-10-CM | POA: Diagnosis not present

## 2020-01-28 DIAGNOSIS — M9902 Segmental and somatic dysfunction of thoracic region: Secondary | ICD-10-CM | POA: Diagnosis not present

## 2020-01-28 DIAGNOSIS — M5417 Radiculopathy, lumbosacral region: Secondary | ICD-10-CM | POA: Diagnosis not present

## 2020-01-28 DIAGNOSIS — M9904 Segmental and somatic dysfunction of sacral region: Secondary | ICD-10-CM | POA: Diagnosis not present

## 2020-02-03 DIAGNOSIS — M5417 Radiculopathy, lumbosacral region: Secondary | ICD-10-CM | POA: Diagnosis not present

## 2020-02-03 DIAGNOSIS — M9903 Segmental and somatic dysfunction of lumbar region: Secondary | ICD-10-CM | POA: Diagnosis not present

## 2020-02-03 DIAGNOSIS — M9902 Segmental and somatic dysfunction of thoracic region: Secondary | ICD-10-CM | POA: Diagnosis not present

## 2020-02-03 DIAGNOSIS — M9904 Segmental and somatic dysfunction of sacral region: Secondary | ICD-10-CM | POA: Diagnosis not present

## 2020-02-10 DIAGNOSIS — M9902 Segmental and somatic dysfunction of thoracic region: Secondary | ICD-10-CM | POA: Diagnosis not present

## 2020-02-10 DIAGNOSIS — M9904 Segmental and somatic dysfunction of sacral region: Secondary | ICD-10-CM | POA: Diagnosis not present

## 2020-02-10 DIAGNOSIS — M5417 Radiculopathy, lumbosacral region: Secondary | ICD-10-CM | POA: Diagnosis not present

## 2020-02-10 DIAGNOSIS — M9903 Segmental and somatic dysfunction of lumbar region: Secondary | ICD-10-CM | POA: Diagnosis not present

## 2020-02-17 ENCOUNTER — Ambulatory Visit
Admission: RE | Admit: 2020-02-17 | Discharge: 2020-02-17 | Disposition: A | Discharge status: Home / Self Care | Payer: BC Managed Care – PPO | Source: Ambulatory Visit | Attending: Family Medicine | Admitting: Family Medicine

## 2020-02-17 ENCOUNTER — Other Ambulatory Visit: Payer: Self-pay

## 2020-02-17 DIAGNOSIS — Z1231 Encounter for screening mammogram for malignant neoplasm of breast: Secondary | ICD-10-CM | POA: Diagnosis not present

## 2020-02-20 DIAGNOSIS — M9902 Segmental and somatic dysfunction of thoracic region: Secondary | ICD-10-CM | POA: Diagnosis not present

## 2020-02-20 DIAGNOSIS — M9904 Segmental and somatic dysfunction of sacral region: Secondary | ICD-10-CM | POA: Diagnosis not present

## 2020-02-20 DIAGNOSIS — M5417 Radiculopathy, lumbosacral region: Secondary | ICD-10-CM | POA: Diagnosis not present

## 2020-02-20 DIAGNOSIS — M9903 Segmental and somatic dysfunction of lumbar region: Secondary | ICD-10-CM | POA: Diagnosis not present

## 2020-04-28 DIAGNOSIS — M5442 Lumbago with sciatica, left side: Secondary | ICD-10-CM | POA: Diagnosis not present

## 2020-05-01 DIAGNOSIS — M5442 Lumbago with sciatica, left side: Secondary | ICD-10-CM | POA: Diagnosis not present

## 2020-05-04 DIAGNOSIS — M5442 Lumbago with sciatica, left side: Secondary | ICD-10-CM | POA: Diagnosis not present

## 2020-05-06 DIAGNOSIS — M5442 Lumbago with sciatica, left side: Secondary | ICD-10-CM | POA: Diagnosis not present

## 2020-05-11 DIAGNOSIS — T391X1A Poisoning by 4-Aminophenol derivatives, accidental (unintentional), initial encounter: Secondary | ICD-10-CM | POA: Diagnosis not present

## 2020-05-11 DIAGNOSIS — R7303 Prediabetes: Secondary | ICD-10-CM | POA: Diagnosis not present

## 2020-05-11 DIAGNOSIS — M5442 Lumbago with sciatica, left side: Secondary | ICD-10-CM | POA: Diagnosis not present

## 2020-05-11 DIAGNOSIS — Z13220 Encounter for screening for lipoid disorders: Secondary | ICD-10-CM | POA: Diagnosis not present

## 2020-05-13 ENCOUNTER — Other Ambulatory Visit: Payer: Self-pay | Admitting: Family Medicine

## 2020-05-13 DIAGNOSIS — M5442 Lumbago with sciatica, left side: Secondary | ICD-10-CM | POA: Diagnosis not present

## 2020-05-13 DIAGNOSIS — M5432 Sciatica, left side: Secondary | ICD-10-CM

## 2020-05-25 DIAGNOSIS — M5442 Lumbago with sciatica, left side: Secondary | ICD-10-CM | POA: Diagnosis not present

## 2020-05-27 DIAGNOSIS — M5442 Lumbago with sciatica, left side: Secondary | ICD-10-CM | POA: Diagnosis not present

## 2020-06-01 DIAGNOSIS — M5442 Lumbago with sciatica, left side: Secondary | ICD-10-CM | POA: Diagnosis not present

## 2020-06-05 DIAGNOSIS — M5442 Lumbago with sciatica, left side: Secondary | ICD-10-CM | POA: Diagnosis not present

## 2020-06-15 DIAGNOSIS — M5442 Lumbago with sciatica, left side: Secondary | ICD-10-CM | POA: Diagnosis not present

## 2020-06-17 ENCOUNTER — Ambulatory Visit
Admission: RE | Admit: 2020-06-17 | Discharge: 2020-06-17 | Disposition: A | Discharge status: Home / Self Care | Payer: BC Managed Care – PPO | Source: Ambulatory Visit | Attending: Family Medicine | Admitting: Family Medicine

## 2020-06-17 ENCOUNTER — Other Ambulatory Visit: Payer: Self-pay

## 2020-06-17 DIAGNOSIS — M4726 Other spondylosis with radiculopathy, lumbar region: Secondary | ICD-10-CM | POA: Diagnosis not present

## 2020-06-17 DIAGNOSIS — M5432 Sciatica, left side: Secondary | ICD-10-CM

## 2020-06-17 DIAGNOSIS — M5116 Intervertebral disc disorders with radiculopathy, lumbar region: Secondary | ICD-10-CM | POA: Diagnosis not present

## 2020-06-17 DIAGNOSIS — M7138 Other bursal cyst, other site: Secondary | ICD-10-CM | POA: Diagnosis not present

## 2020-06-17 DIAGNOSIS — M5117 Intervertebral disc disorders with radiculopathy, lumbosacral region: Secondary | ICD-10-CM | POA: Diagnosis not present

## 2020-06-17 DIAGNOSIS — M5442 Lumbago with sciatica, left side: Secondary | ICD-10-CM

## 2020-07-03 DIAGNOSIS — M5416 Radiculopathy, lumbar region: Secondary | ICD-10-CM | POA: Diagnosis not present

## 2020-08-31 DIAGNOSIS — M48061 Spinal stenosis, lumbar region without neurogenic claudication: Secondary | ICD-10-CM | POA: Diagnosis not present

## 2020-09-09 DIAGNOSIS — L570 Actinic keratosis: Secondary | ICD-10-CM | POA: Diagnosis not present

## 2020-09-09 DIAGNOSIS — L723 Sebaceous cyst: Secondary | ICD-10-CM | POA: Diagnosis not present

## 2020-09-09 DIAGNOSIS — L565 Disseminated superficial actinic porokeratosis (DSAP): Secondary | ICD-10-CM | POA: Diagnosis not present

## 2020-09-29 DIAGNOSIS — M48061 Spinal stenosis, lumbar region without neurogenic claudication: Secondary | ICD-10-CM | POA: Diagnosis not present

## 2020-10-19 DIAGNOSIS — M5416 Radiculopathy, lumbar region: Secondary | ICD-10-CM | POA: Diagnosis not present

## 2020-11-05 DIAGNOSIS — M5416 Radiculopathy, lumbar region: Secondary | ICD-10-CM | POA: Diagnosis not present

## 2020-11-19 DIAGNOSIS — M545 Low back pain, unspecified: Secondary | ICD-10-CM | POA: Diagnosis not present

## 2020-11-19 DIAGNOSIS — M5442 Lumbago with sciatica, left side: Secondary | ICD-10-CM | POA: Diagnosis not present

## 2020-11-23 DIAGNOSIS — L720 Epidermal cyst: Secondary | ICD-10-CM | POA: Diagnosis not present

## 2020-11-23 DIAGNOSIS — L723 Sebaceous cyst: Secondary | ICD-10-CM | POA: Diagnosis not present

## 2020-11-30 DIAGNOSIS — M48062 Spinal stenosis, lumbar region with neurogenic claudication: Secondary | ICD-10-CM | POA: Diagnosis not present

## 2020-12-02 DIAGNOSIS — M48062 Spinal stenosis, lumbar region with neurogenic claudication: Secondary | ICD-10-CM | POA: Diagnosis not present

## 2020-12-21 DIAGNOSIS — M5416 Radiculopathy, lumbar region: Secondary | ICD-10-CM | POA: Diagnosis not present

## 2020-12-28 DIAGNOSIS — M48062 Spinal stenosis, lumbar region with neurogenic claudication: Secondary | ICD-10-CM | POA: Diagnosis not present

## 2021-01-14 DIAGNOSIS — M48061 Spinal stenosis, lumbar region without neurogenic claudication: Secondary | ICD-10-CM | POA: Diagnosis not present

## 2021-01-14 DIAGNOSIS — Z87891 Personal history of nicotine dependence: Secondary | ICD-10-CM | POA: Diagnosis not present

## 2021-01-14 DIAGNOSIS — M1990 Unspecified osteoarthritis, unspecified site: Secondary | ICD-10-CM | POA: Diagnosis not present

## 2021-01-14 DIAGNOSIS — L409 Psoriasis, unspecified: Secondary | ICD-10-CM | POA: Diagnosis not present

## 2021-01-14 DIAGNOSIS — Z96651 Presence of right artificial knee joint: Secondary | ICD-10-CM | POA: Diagnosis not present

## 2021-01-14 DIAGNOSIS — M549 Dorsalgia, unspecified: Secondary | ICD-10-CM | POA: Diagnosis not present

## 2021-01-14 DIAGNOSIS — M79605 Pain in left leg: Secondary | ICD-10-CM | POA: Diagnosis not present

## 2021-01-14 DIAGNOSIS — Z79899 Other long term (current) drug therapy: Secondary | ICD-10-CM | POA: Diagnosis not present

## 2021-01-19 DIAGNOSIS — M48061 Spinal stenosis, lumbar region without neurogenic claudication: Secondary | ICD-10-CM | POA: Diagnosis not present

## 2021-01-19 DIAGNOSIS — Z87891 Personal history of nicotine dependence: Secondary | ICD-10-CM | POA: Diagnosis not present

## 2021-01-19 DIAGNOSIS — Z79899 Other long term (current) drug therapy: Secondary | ICD-10-CM | POA: Diagnosis not present

## 2021-01-19 DIAGNOSIS — M4802 Spinal stenosis, cervical region: Secondary | ICD-10-CM | POA: Diagnosis not present

## 2021-01-19 DIAGNOSIS — Z96651 Presence of right artificial knee joint: Secondary | ICD-10-CM | POA: Diagnosis not present

## 2021-01-19 DIAGNOSIS — M79605 Pain in left leg: Secondary | ICD-10-CM | POA: Diagnosis not present

## 2021-01-19 DIAGNOSIS — M48062 Spinal stenosis, lumbar region with neurogenic claudication: Secondary | ICD-10-CM | POA: Diagnosis not present

## 2021-01-19 DIAGNOSIS — L409 Psoriasis, unspecified: Secondary | ICD-10-CM | POA: Diagnosis not present

## 2021-01-19 DIAGNOSIS — M1990 Unspecified osteoarthritis, unspecified site: Secondary | ICD-10-CM | POA: Diagnosis not present

## 2021-01-19 DIAGNOSIS — M549 Dorsalgia, unspecified: Secondary | ICD-10-CM | POA: Diagnosis not present

## 2021-02-05 ENCOUNTER — Other Ambulatory Visit: Payer: Self-pay | Admitting: Family Medicine

## 2021-02-05 DIAGNOSIS — Z1231 Encounter for screening mammogram for malignant neoplasm of breast: Secondary | ICD-10-CM

## 2021-02-08 DIAGNOSIS — Z48811 Encounter for surgical aftercare following surgery on the nervous system: Secondary | ICD-10-CM | POA: Diagnosis not present

## 2021-02-10 DIAGNOSIS — L570 Actinic keratosis: Secondary | ICD-10-CM | POA: Diagnosis not present

## 2021-02-10 DIAGNOSIS — L565 Disseminated superficial actinic porokeratosis (DSAP): Secondary | ICD-10-CM | POA: Diagnosis not present

## 2021-02-11 DIAGNOSIS — F17201 Nicotine dependence, unspecified, in remission: Secondary | ICD-10-CM | POA: Diagnosis not present

## 2021-02-11 DIAGNOSIS — Z1211 Encounter for screening for malignant neoplasm of colon: Secondary | ICD-10-CM | POA: Diagnosis not present

## 2021-02-11 DIAGNOSIS — Z13220 Encounter for screening for lipoid disorders: Secondary | ICD-10-CM | POA: Diagnosis not present

## 2021-02-11 DIAGNOSIS — R7303 Prediabetes: Secondary | ICD-10-CM | POA: Diagnosis not present

## 2021-02-11 DIAGNOSIS — F325 Major depressive disorder, single episode, in full remission: Secondary | ICD-10-CM | POA: Diagnosis not present

## 2021-02-11 DIAGNOSIS — Z0000 Encounter for general adult medical examination without abnormal findings: Secondary | ICD-10-CM | POA: Diagnosis not present

## 2021-02-13 ENCOUNTER — Other Ambulatory Visit: Payer: BC Managed Care – PPO | Admitting: Family Medicine

## 2021-02-13 DIAGNOSIS — F17201 Nicotine dependence, unspecified, in remission: Secondary | ICD-10-CM

## 2021-02-16 DIAGNOSIS — M50322 Other cervical disc degeneration at C5-C6 level: Secondary | ICD-10-CM | POA: Diagnosis not present

## 2021-02-16 DIAGNOSIS — M9903 Segmental and somatic dysfunction of lumbar region: Secondary | ICD-10-CM | POA: Diagnosis not present

## 2021-02-16 DIAGNOSIS — M5116 Intervertebral disc disorders with radiculopathy, lumbar region: Secondary | ICD-10-CM | POA: Diagnosis not present

## 2021-02-16 DIAGNOSIS — M9901 Segmental and somatic dysfunction of cervical region: Secondary | ICD-10-CM | POA: Diagnosis not present

## 2021-02-18 DIAGNOSIS — M50322 Other cervical disc degeneration at C5-C6 level: Secondary | ICD-10-CM | POA: Diagnosis not present

## 2021-02-18 DIAGNOSIS — M9903 Segmental and somatic dysfunction of lumbar region: Secondary | ICD-10-CM | POA: Diagnosis not present

## 2021-02-18 DIAGNOSIS — M5116 Intervertebral disc disorders with radiculopathy, lumbar region: Secondary | ICD-10-CM | POA: Diagnosis not present

## 2021-02-18 DIAGNOSIS — M9901 Segmental and somatic dysfunction of cervical region: Secondary | ICD-10-CM | POA: Diagnosis not present

## 2021-02-22 DIAGNOSIS — M9901 Segmental and somatic dysfunction of cervical region: Secondary | ICD-10-CM | POA: Diagnosis not present

## 2021-02-22 DIAGNOSIS — M5116 Intervertebral disc disorders with radiculopathy, lumbar region: Secondary | ICD-10-CM | POA: Diagnosis not present

## 2021-02-22 DIAGNOSIS — M50322 Other cervical disc degeneration at C5-C6 level: Secondary | ICD-10-CM | POA: Diagnosis not present

## 2021-02-22 DIAGNOSIS — M9903 Segmental and somatic dysfunction of lumbar region: Secondary | ICD-10-CM | POA: Diagnosis not present

## 2021-02-23 DIAGNOSIS — M9903 Segmental and somatic dysfunction of lumbar region: Secondary | ICD-10-CM | POA: Diagnosis not present

## 2021-02-23 DIAGNOSIS — M9901 Segmental and somatic dysfunction of cervical region: Secondary | ICD-10-CM | POA: Diagnosis not present

## 2021-02-23 DIAGNOSIS — M50322 Other cervical disc degeneration at C5-C6 level: Secondary | ICD-10-CM | POA: Diagnosis not present

## 2021-02-23 DIAGNOSIS — M5116 Intervertebral disc disorders with radiculopathy, lumbar region: Secondary | ICD-10-CM | POA: Diagnosis not present

## 2021-02-25 DIAGNOSIS — M50322 Other cervical disc degeneration at C5-C6 level: Secondary | ICD-10-CM | POA: Diagnosis not present

## 2021-02-25 DIAGNOSIS — M9901 Segmental and somatic dysfunction of cervical region: Secondary | ICD-10-CM | POA: Diagnosis not present

## 2021-02-25 DIAGNOSIS — M5116 Intervertebral disc disorders with radiculopathy, lumbar region: Secondary | ICD-10-CM | POA: Diagnosis not present

## 2021-02-25 DIAGNOSIS — M9903 Segmental and somatic dysfunction of lumbar region: Secondary | ICD-10-CM | POA: Diagnosis not present

## 2021-02-27 ENCOUNTER — Ambulatory Visit
Admission: RE | Admit: 2021-02-27 | Discharge: 2021-02-27 | Disposition: A | Discharge status: Home / Self Care | Payer: BC Managed Care – PPO | Source: Ambulatory Visit | Attending: Family Medicine | Admitting: Family Medicine

## 2021-02-27 ENCOUNTER — Other Ambulatory Visit: Payer: Self-pay

## 2021-02-27 DIAGNOSIS — I2510 Atherosclerotic heart disease of native coronary artery without angina pectoris: Secondary | ICD-10-CM | POA: Diagnosis not present

## 2021-02-27 DIAGNOSIS — J439 Emphysema, unspecified: Secondary | ICD-10-CM | POA: Diagnosis not present

## 2021-02-27 DIAGNOSIS — F17201 Nicotine dependence, unspecified, in remission: Secondary | ICD-10-CM

## 2021-02-27 DIAGNOSIS — I712 Thoracic aortic aneurysm, without rupture: Secondary | ICD-10-CM | POA: Diagnosis not present

## 2021-02-27 DIAGNOSIS — Z87891 Personal history of nicotine dependence: Secondary | ICD-10-CM | POA: Diagnosis not present

## 2021-03-01 DIAGNOSIS — M50322 Other cervical disc degeneration at C5-C6 level: Secondary | ICD-10-CM | POA: Diagnosis not present

## 2021-03-01 DIAGNOSIS — M9901 Segmental and somatic dysfunction of cervical region: Secondary | ICD-10-CM | POA: Diagnosis not present

## 2021-03-01 DIAGNOSIS — M5116 Intervertebral disc disorders with radiculopathy, lumbar region: Secondary | ICD-10-CM | POA: Diagnosis not present

## 2021-03-01 DIAGNOSIS — M9903 Segmental and somatic dysfunction of lumbar region: Secondary | ICD-10-CM | POA: Diagnosis not present

## 2021-03-03 DIAGNOSIS — M9901 Segmental and somatic dysfunction of cervical region: Secondary | ICD-10-CM | POA: Diagnosis not present

## 2021-03-03 DIAGNOSIS — M50322 Other cervical disc degeneration at C5-C6 level: Secondary | ICD-10-CM | POA: Diagnosis not present

## 2021-03-03 DIAGNOSIS — M5116 Intervertebral disc disorders with radiculopathy, lumbar region: Secondary | ICD-10-CM | POA: Diagnosis not present

## 2021-03-03 DIAGNOSIS — M9903 Segmental and somatic dysfunction of lumbar region: Secondary | ICD-10-CM | POA: Diagnosis not present

## 2021-03-04 DIAGNOSIS — M9903 Segmental and somatic dysfunction of lumbar region: Secondary | ICD-10-CM | POA: Diagnosis not present

## 2021-03-04 DIAGNOSIS — M50322 Other cervical disc degeneration at C5-C6 level: Secondary | ICD-10-CM | POA: Diagnosis not present

## 2021-03-04 DIAGNOSIS — M5116 Intervertebral disc disorders with radiculopathy, lumbar region: Secondary | ICD-10-CM | POA: Diagnosis not present

## 2021-03-04 DIAGNOSIS — M9901 Segmental and somatic dysfunction of cervical region: Secondary | ICD-10-CM | POA: Diagnosis not present

## 2021-03-09 DIAGNOSIS — M9903 Segmental and somatic dysfunction of lumbar region: Secondary | ICD-10-CM | POA: Diagnosis not present

## 2021-03-09 DIAGNOSIS — M5116 Intervertebral disc disorders with radiculopathy, lumbar region: Secondary | ICD-10-CM | POA: Diagnosis not present

## 2021-03-09 DIAGNOSIS — M9901 Segmental and somatic dysfunction of cervical region: Secondary | ICD-10-CM | POA: Diagnosis not present

## 2021-03-09 DIAGNOSIS — M50322 Other cervical disc degeneration at C5-C6 level: Secondary | ICD-10-CM | POA: Diagnosis not present

## 2021-03-10 DIAGNOSIS — M9901 Segmental and somatic dysfunction of cervical region: Secondary | ICD-10-CM | POA: Diagnosis not present

## 2021-03-10 DIAGNOSIS — M9903 Segmental and somatic dysfunction of lumbar region: Secondary | ICD-10-CM | POA: Diagnosis not present

## 2021-03-10 DIAGNOSIS — M50322 Other cervical disc degeneration at C5-C6 level: Secondary | ICD-10-CM | POA: Diagnosis not present

## 2021-03-10 DIAGNOSIS — M5116 Intervertebral disc disorders with radiculopathy, lumbar region: Secondary | ICD-10-CM | POA: Diagnosis not present

## 2021-03-11 DIAGNOSIS — M5116 Intervertebral disc disorders with radiculopathy, lumbar region: Secondary | ICD-10-CM | POA: Diagnosis not present

## 2021-03-11 DIAGNOSIS — M9903 Segmental and somatic dysfunction of lumbar region: Secondary | ICD-10-CM | POA: Diagnosis not present

## 2021-03-11 DIAGNOSIS — M50322 Other cervical disc degeneration at C5-C6 level: Secondary | ICD-10-CM | POA: Diagnosis not present

## 2021-03-11 DIAGNOSIS — M9901 Segmental and somatic dysfunction of cervical region: Secondary | ICD-10-CM | POA: Diagnosis not present

## 2021-03-16 DIAGNOSIS — M9903 Segmental and somatic dysfunction of lumbar region: Secondary | ICD-10-CM | POA: Diagnosis not present

## 2021-03-16 DIAGNOSIS — M50322 Other cervical disc degeneration at C5-C6 level: Secondary | ICD-10-CM | POA: Diagnosis not present

## 2021-03-16 DIAGNOSIS — M9901 Segmental and somatic dysfunction of cervical region: Secondary | ICD-10-CM | POA: Diagnosis not present

## 2021-03-16 DIAGNOSIS — M5116 Intervertebral disc disorders with radiculopathy, lumbar region: Secondary | ICD-10-CM | POA: Diagnosis not present

## 2021-03-18 DIAGNOSIS — M9901 Segmental and somatic dysfunction of cervical region: Secondary | ICD-10-CM | POA: Diagnosis not present

## 2021-03-18 DIAGNOSIS — M9903 Segmental and somatic dysfunction of lumbar region: Secondary | ICD-10-CM | POA: Diagnosis not present

## 2021-03-18 DIAGNOSIS — M5116 Intervertebral disc disorders with radiculopathy, lumbar region: Secondary | ICD-10-CM | POA: Diagnosis not present

## 2021-03-18 DIAGNOSIS — M50322 Other cervical disc degeneration at C5-C6 level: Secondary | ICD-10-CM | POA: Diagnosis not present

## 2021-03-22 DIAGNOSIS — M5116 Intervertebral disc disorders with radiculopathy, lumbar region: Secondary | ICD-10-CM | POA: Diagnosis not present

## 2021-03-22 DIAGNOSIS — M9903 Segmental and somatic dysfunction of lumbar region: Secondary | ICD-10-CM | POA: Diagnosis not present

## 2021-03-22 DIAGNOSIS — M50322 Other cervical disc degeneration at C5-C6 level: Secondary | ICD-10-CM | POA: Diagnosis not present

## 2021-03-22 DIAGNOSIS — M9901 Segmental and somatic dysfunction of cervical region: Secondary | ICD-10-CM | POA: Diagnosis not present

## 2021-03-24 DIAGNOSIS — M5116 Intervertebral disc disorders with radiculopathy, lumbar region: Secondary | ICD-10-CM | POA: Diagnosis not present

## 2021-03-24 DIAGNOSIS — M50322 Other cervical disc degeneration at C5-C6 level: Secondary | ICD-10-CM | POA: Diagnosis not present

## 2021-03-24 DIAGNOSIS — M9903 Segmental and somatic dysfunction of lumbar region: Secondary | ICD-10-CM | POA: Diagnosis not present

## 2021-03-24 DIAGNOSIS — M9901 Segmental and somatic dysfunction of cervical region: Secondary | ICD-10-CM | POA: Diagnosis not present

## 2021-03-29 DIAGNOSIS — M9901 Segmental and somatic dysfunction of cervical region: Secondary | ICD-10-CM | POA: Diagnosis not present

## 2021-03-29 DIAGNOSIS — M5116 Intervertebral disc disorders with radiculopathy, lumbar region: Secondary | ICD-10-CM | POA: Diagnosis not present

## 2021-03-29 DIAGNOSIS — M9903 Segmental and somatic dysfunction of lumbar region: Secondary | ICD-10-CM | POA: Diagnosis not present

## 2021-03-29 DIAGNOSIS — M50322 Other cervical disc degeneration at C5-C6 level: Secondary | ICD-10-CM | POA: Diagnosis not present

## 2021-04-01 ENCOUNTER — Ambulatory Visit
Admission: RE | Admit: 2021-04-01 | Discharge: 2021-04-01 | Disposition: A | Discharge status: Home / Self Care | Payer: BC Managed Care – PPO | Source: Ambulatory Visit | Attending: Family Medicine | Admitting: Family Medicine

## 2021-04-01 ENCOUNTER — Other Ambulatory Visit: Payer: Self-pay

## 2021-04-01 DIAGNOSIS — Z1231 Encounter for screening mammogram for malignant neoplasm of breast: Secondary | ICD-10-CM | POA: Diagnosis not present

## 2021-04-07 DIAGNOSIS — M9903 Segmental and somatic dysfunction of lumbar region: Secondary | ICD-10-CM | POA: Diagnosis not present

## 2021-04-07 DIAGNOSIS — M50322 Other cervical disc degeneration at C5-C6 level: Secondary | ICD-10-CM | POA: Diagnosis not present

## 2021-04-07 DIAGNOSIS — M9901 Segmental and somatic dysfunction of cervical region: Secondary | ICD-10-CM | POA: Diagnosis not present

## 2021-04-07 DIAGNOSIS — M5116 Intervertebral disc disorders with radiculopathy, lumbar region: Secondary | ICD-10-CM | POA: Diagnosis not present

## 2021-04-15 DIAGNOSIS — Z01818 Encounter for other preprocedural examination: Secondary | ICD-10-CM | POA: Diagnosis not present

## 2021-04-20 DIAGNOSIS — E785 Hyperlipidemia, unspecified: Secondary | ICD-10-CM | POA: Diagnosis not present

## 2021-04-20 DIAGNOSIS — Z79899 Other long term (current) drug therapy: Secondary | ICD-10-CM | POA: Diagnosis not present

## 2021-04-22 DIAGNOSIS — M9903 Segmental and somatic dysfunction of lumbar region: Secondary | ICD-10-CM | POA: Diagnosis not present

## 2021-04-22 DIAGNOSIS — M9901 Segmental and somatic dysfunction of cervical region: Secondary | ICD-10-CM | POA: Diagnosis not present

## 2021-04-22 DIAGNOSIS — M5116 Intervertebral disc disorders with radiculopathy, lumbar region: Secondary | ICD-10-CM | POA: Diagnosis not present

## 2021-04-22 DIAGNOSIS — M50322 Other cervical disc degeneration at C5-C6 level: Secondary | ICD-10-CM | POA: Diagnosis not present

## 2021-04-28 DIAGNOSIS — M50322 Other cervical disc degeneration at C5-C6 level: Secondary | ICD-10-CM | POA: Diagnosis not present

## 2021-04-28 DIAGNOSIS — M9903 Segmental and somatic dysfunction of lumbar region: Secondary | ICD-10-CM | POA: Diagnosis not present

## 2021-04-28 DIAGNOSIS — M9901 Segmental and somatic dysfunction of cervical region: Secondary | ICD-10-CM | POA: Diagnosis not present

## 2021-04-28 DIAGNOSIS — M5116 Intervertebral disc disorders with radiculopathy, lumbar region: Secondary | ICD-10-CM | POA: Diagnosis not present

## 2021-05-19 DIAGNOSIS — M9901 Segmental and somatic dysfunction of cervical region: Secondary | ICD-10-CM | POA: Diagnosis not present

## 2021-05-19 DIAGNOSIS — M9903 Segmental and somatic dysfunction of lumbar region: Secondary | ICD-10-CM | POA: Diagnosis not present

## 2021-05-19 DIAGNOSIS — M50322 Other cervical disc degeneration at C5-C6 level: Secondary | ICD-10-CM | POA: Diagnosis not present

## 2021-05-19 DIAGNOSIS — M5116 Intervertebral disc disorders with radiculopathy, lumbar region: Secondary | ICD-10-CM | POA: Diagnosis not present

## 2021-08-16 DIAGNOSIS — F325 Major depressive disorder, single episode, in full remission: Secondary | ICD-10-CM | POA: Diagnosis not present

## 2021-08-16 DIAGNOSIS — Z7189 Other specified counseling: Secondary | ICD-10-CM | POA: Diagnosis not present

## 2021-08-16 DIAGNOSIS — Z23 Encounter for immunization: Secondary | ICD-10-CM | POA: Diagnosis not present

## 2021-08-16 DIAGNOSIS — N6320 Unspecified lump in the left breast, unspecified quadrant: Secondary | ICD-10-CM | POA: Diagnosis not present

## 2021-08-16 DIAGNOSIS — F419 Anxiety disorder, unspecified: Secondary | ICD-10-CM | POA: Diagnosis not present

## 2021-08-18 ENCOUNTER — Other Ambulatory Visit: Payer: Self-pay | Admitting: Family Medicine

## 2021-08-18 DIAGNOSIS — N6320 Unspecified lump in the left breast, unspecified quadrant: Secondary | ICD-10-CM

## 2021-09-06 DIAGNOSIS — D225 Melanocytic nevi of trunk: Secondary | ICD-10-CM | POA: Diagnosis not present

## 2021-09-06 DIAGNOSIS — L821 Other seborrheic keratosis: Secondary | ICD-10-CM | POA: Diagnosis not present

## 2021-09-06 DIAGNOSIS — Z85828 Personal history of other malignant neoplasm of skin: Secondary | ICD-10-CM | POA: Diagnosis not present

## 2021-09-06 DIAGNOSIS — L814 Other melanin hyperpigmentation: Secondary | ICD-10-CM | POA: Diagnosis not present

## 2021-09-09 ENCOUNTER — Other Ambulatory Visit: Payer: BC Managed Care – PPO

## 2021-09-28 ENCOUNTER — Other Ambulatory Visit: Payer: BC Managed Care – PPO

## 2021-10-06 DIAGNOSIS — Z1211 Encounter for screening for malignant neoplasm of colon: Secondary | ICD-10-CM | POA: Diagnosis not present

## 2021-10-06 DIAGNOSIS — K648 Other hemorrhoids: Secondary | ICD-10-CM | POA: Diagnosis not present

## 2021-10-06 DIAGNOSIS — D122 Benign neoplasm of ascending colon: Secondary | ICD-10-CM | POA: Diagnosis not present

## 2021-10-26 DIAGNOSIS — U071 COVID-19: Secondary | ICD-10-CM | POA: Diagnosis not present

## 2022-02-16 ENCOUNTER — Other Ambulatory Visit: Payer: Self-pay | Admitting: Family Medicine

## 2022-02-16 DIAGNOSIS — I700 Atherosclerosis of aorta: Secondary | ICD-10-CM | POA: Diagnosis not present

## 2022-02-16 DIAGNOSIS — Z0000 Encounter for general adult medical examination without abnormal findings: Secondary | ICD-10-CM | POA: Diagnosis not present

## 2022-02-16 DIAGNOSIS — F17201 Nicotine dependence, unspecified, in remission: Secondary | ICD-10-CM

## 2022-02-16 DIAGNOSIS — I77810 Thoracic aortic ectasia: Secondary | ICD-10-CM | POA: Diagnosis not present

## 2022-02-16 DIAGNOSIS — E785 Hyperlipidemia, unspecified: Secondary | ICD-10-CM | POA: Diagnosis not present

## 2022-02-16 DIAGNOSIS — R7303 Prediabetes: Secondary | ICD-10-CM | POA: Diagnosis not present

## 2022-03-08 ENCOUNTER — Other Ambulatory Visit: Payer: Self-pay | Admitting: Family Medicine

## 2022-03-08 DIAGNOSIS — Z1231 Encounter for screening mammogram for malignant neoplasm of breast: Secondary | ICD-10-CM

## 2022-03-15 ENCOUNTER — Other Ambulatory Visit: Payer: BC Managed Care – PPO

## 2022-03-15 IMAGING — MG DIGITAL SCREENING BILAT W/ TOMO W/ CAD
6 of 10 series · 6 of 30 positions shown · non-contrast
Comparison: Previous exam(s).

CLINICAL DATA: Screening.

EXAM:
DIGITAL SCREENING BILATERAL MAMMOGRAM WITH TOMO AND CAD

[L CC synth-2D (1 of 2)]
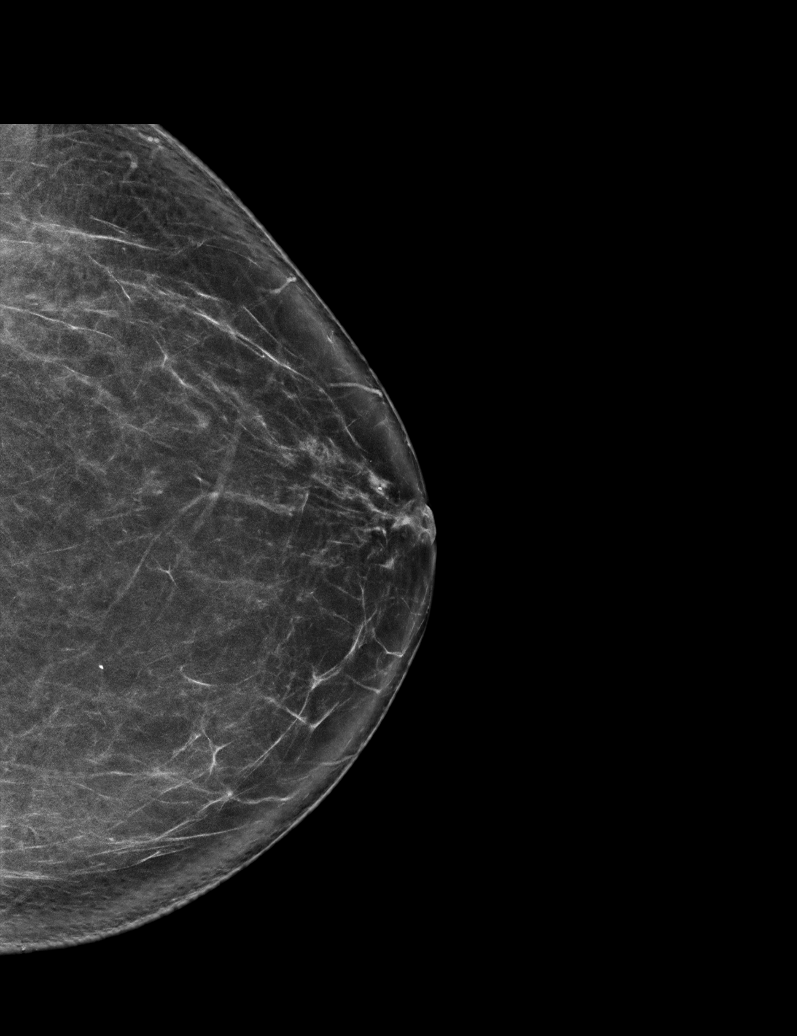

[R CC synth-2D]
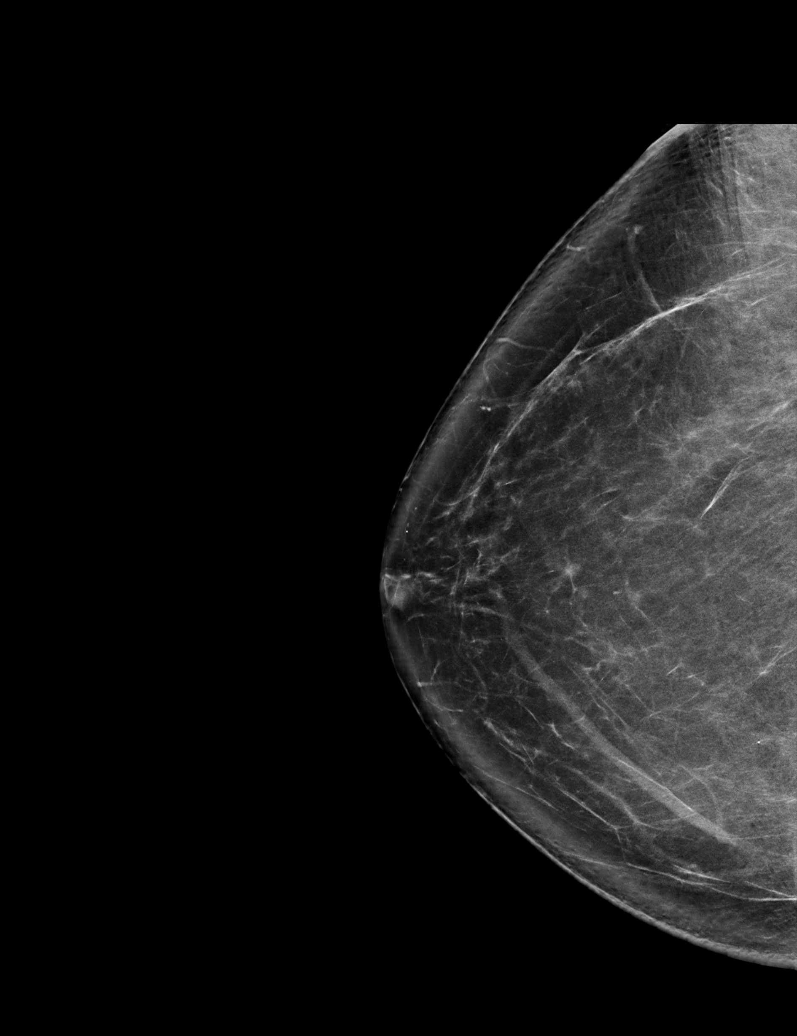

[R MLO synth-2D]
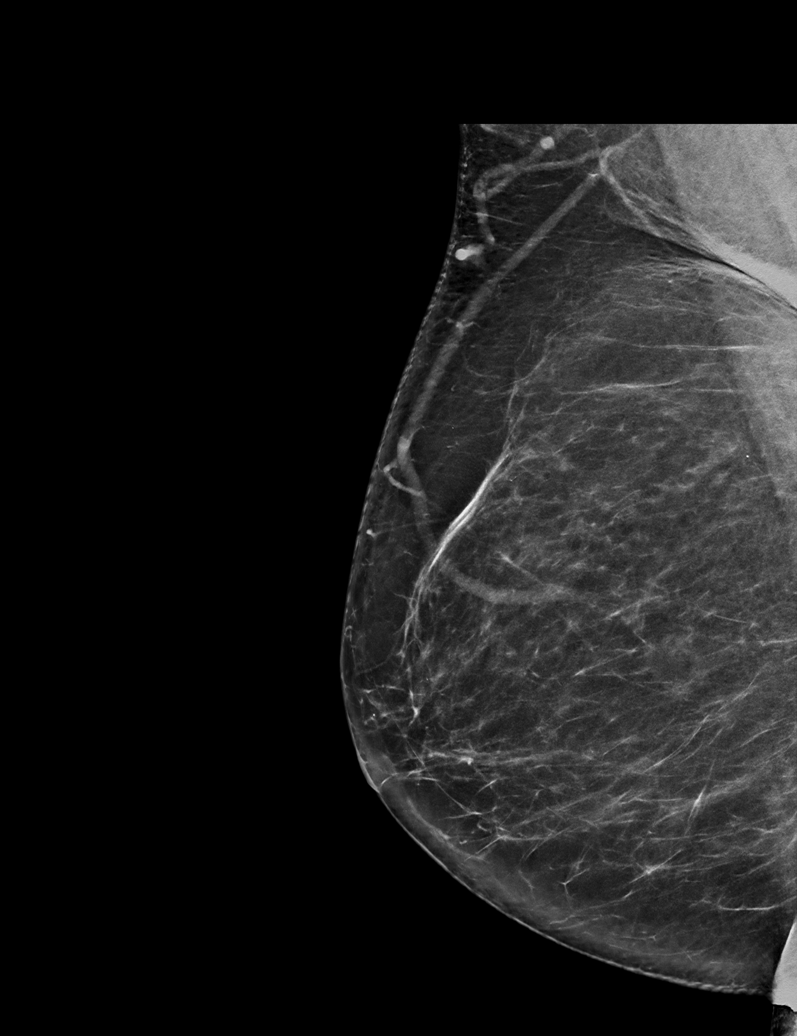

[L CC synth-2D (2 of 2)]
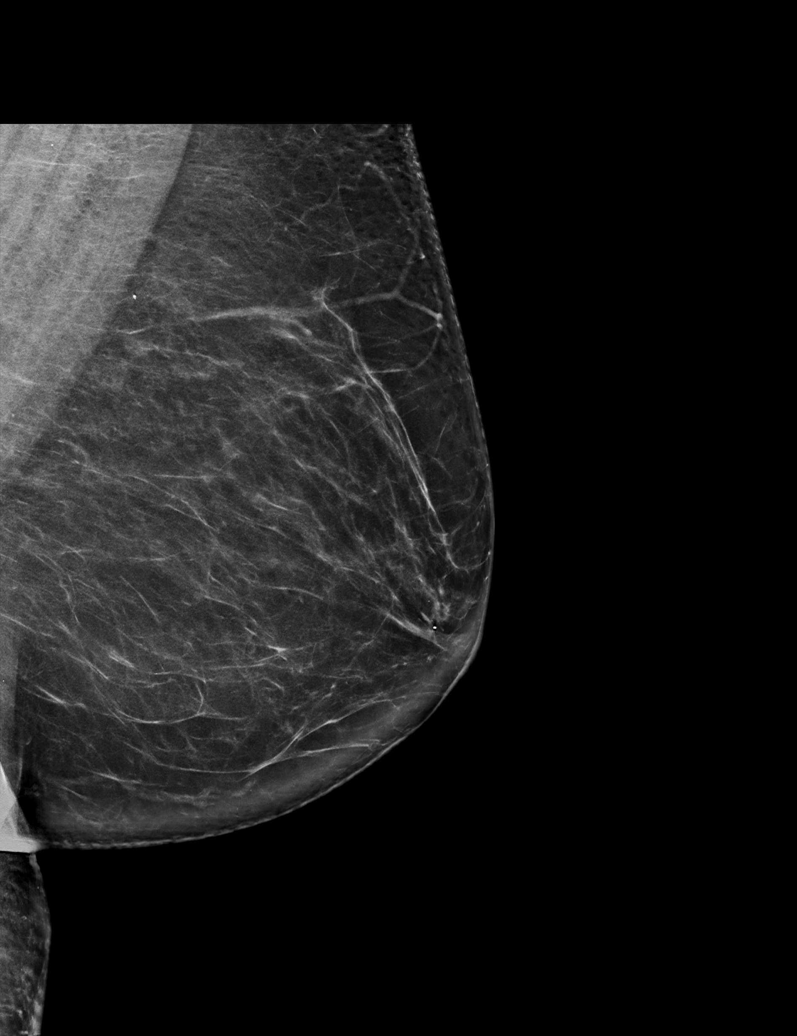

[L MLO synth-2D]
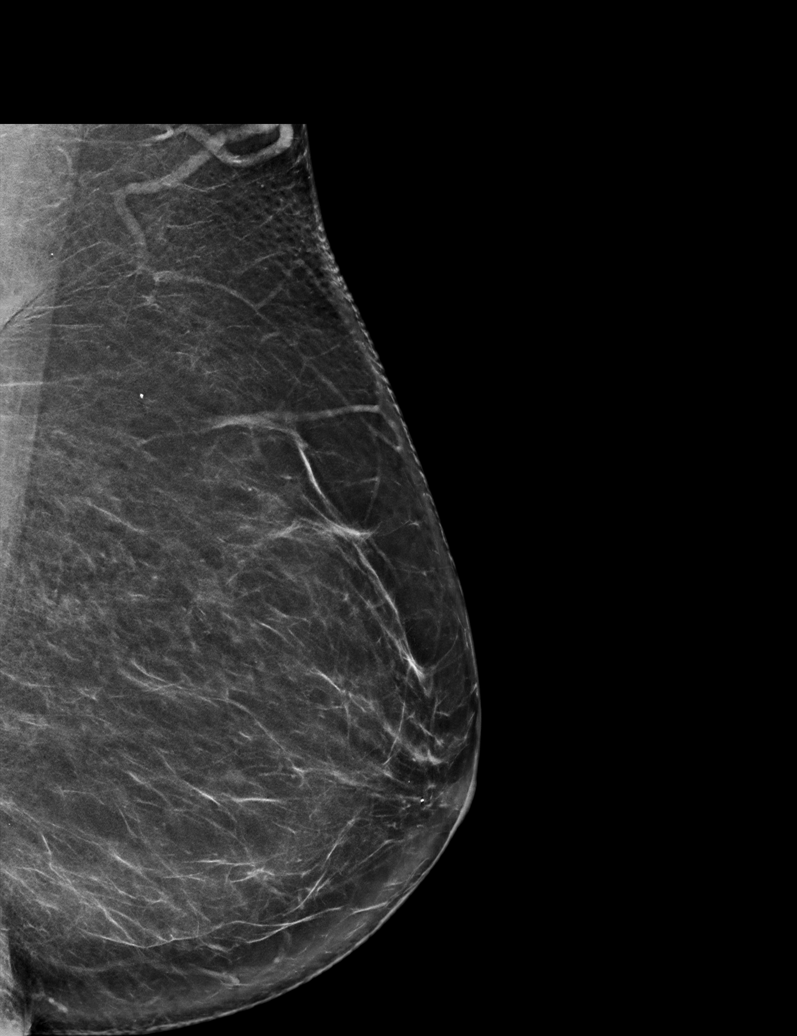

[R MLO tomo · tomo slice 37/73.0]
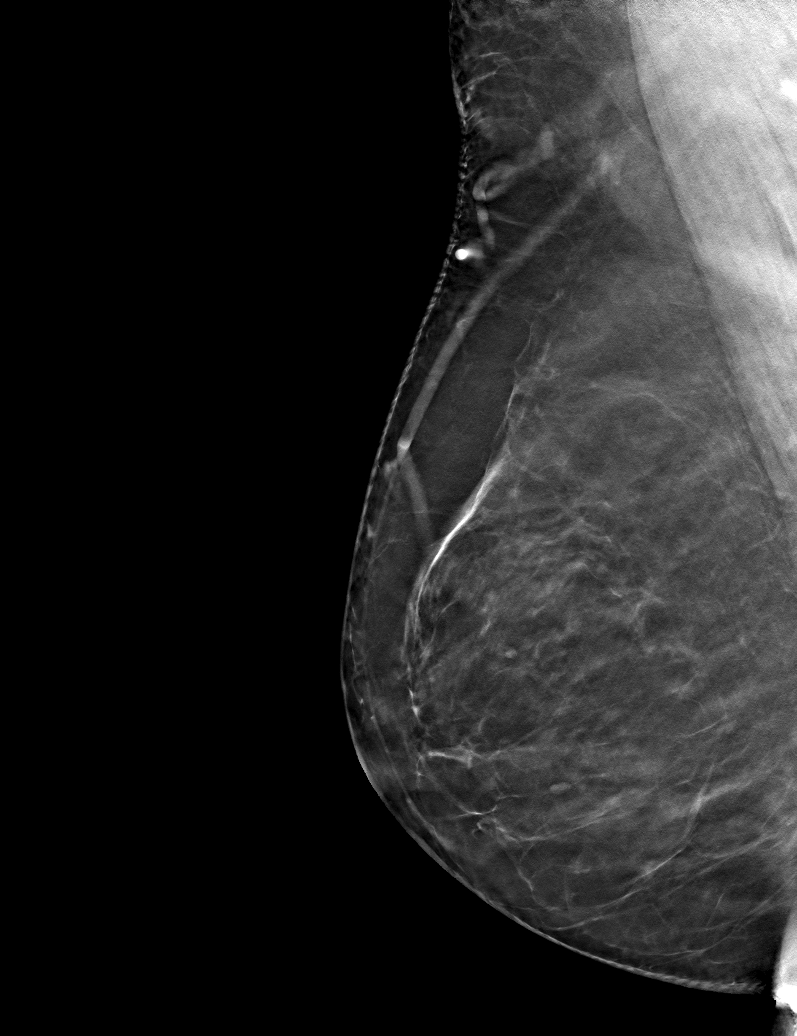

[6 of 30 positions shown; findings below may reference images not displayed]

ACR Breast Density Category b: There are scattered areas of
fibroglandular density.
FINDINGS: There are no findings suspicious for malignancy. Images were
processed with CAD.
IMPRESSION: No mammographic evidence of malignancy. A result letter of this
screening mammogram will be mailed directly to the patient.

RECOMMENDATION:
Screening mammogram in one year. (Code:[id number])

BI-RADS CATEGORY  1: Negative.

## 2022-04-04 ENCOUNTER — Ambulatory Visit
Admission: RE | Admit: 2022-04-04 | Discharge: 2022-04-04 | Disposition: A | Discharge status: Home / Self Care | Payer: BC Managed Care – PPO | Source: Ambulatory Visit | Attending: Family Medicine | Admitting: Family Medicine

## 2022-04-04 DIAGNOSIS — Z87891 Personal history of nicotine dependence: Secondary | ICD-10-CM | POA: Diagnosis not present

## 2022-04-04 DIAGNOSIS — K8020 Calculus of gallbladder without cholecystitis without obstruction: Secondary | ICD-10-CM | POA: Diagnosis not present

## 2022-04-04 DIAGNOSIS — F17201 Nicotine dependence, unspecified, in remission: Secondary | ICD-10-CM

## 2022-04-04 DIAGNOSIS — E041 Nontoxic single thyroid nodule: Secondary | ICD-10-CM | POA: Diagnosis not present

## 2022-04-04 DIAGNOSIS — J432 Centrilobular emphysema: Secondary | ICD-10-CM | POA: Diagnosis not present

## 2022-04-11 ENCOUNTER — Ambulatory Visit
Admission: RE | Admit: 2022-04-11 | Discharge: 2022-04-11 | Disposition: A | Discharge status: Home / Self Care | Payer: BC Managed Care – PPO | Source: Ambulatory Visit | Attending: Family Medicine | Admitting: Family Medicine

## 2022-04-11 DIAGNOSIS — Z1231 Encounter for screening mammogram for malignant neoplasm of breast: Secondary | ICD-10-CM | POA: Diagnosis not present

## 2022-04-14 ENCOUNTER — Other Ambulatory Visit: Payer: Self-pay | Admitting: Family Medicine

## 2022-04-14 DIAGNOSIS — R9389 Abnormal findings on diagnostic imaging of other specified body structures: Secondary | ICD-10-CM

## 2022-05-10 ENCOUNTER — Other Ambulatory Visit: Payer: BC Managed Care – PPO

## 2022-06-07 ENCOUNTER — Ambulatory Visit
Admission: RE | Admit: 2022-06-07 | Discharge: 2022-06-07 | Disposition: A | Discharge status: Home / Self Care | Payer: BC Managed Care – PPO | Source: Ambulatory Visit | Attending: Family Medicine | Admitting: Family Medicine

## 2022-06-07 DIAGNOSIS — R9389 Abnormal findings on diagnostic imaging of other specified body structures: Secondary | ICD-10-CM

## 2022-06-07 DIAGNOSIS — R590 Localized enlarged lymph nodes: Secondary | ICD-10-CM | POA: Diagnosis not present

## 2022-06-07 DIAGNOSIS — R918 Other nonspecific abnormal finding of lung field: Secondary | ICD-10-CM | POA: Diagnosis not present

## 2022-06-07 DIAGNOSIS — I700 Atherosclerosis of aorta: Secondary | ICD-10-CM | POA: Diagnosis not present

## 2022-06-07 DIAGNOSIS — J84112 Idiopathic pulmonary fibrosis: Secondary | ICD-10-CM | POA: Diagnosis not present

## 2022-06-30 DIAGNOSIS — M180 Bilateral primary osteoarthritis of first carpometacarpal joints: Secondary | ICD-10-CM | POA: Diagnosis not present

## 2022-06-30 DIAGNOSIS — R200 Anesthesia of skin: Secondary | ICD-10-CM | POA: Diagnosis not present

## 2022-06-30 DIAGNOSIS — M1812 Unilateral primary osteoarthritis of first carpometacarpal joint, left hand: Secondary | ICD-10-CM | POA: Diagnosis not present

## 2022-06-30 DIAGNOSIS — M1811 Unilateral primary osteoarthritis of first carpometacarpal joint, right hand: Secondary | ICD-10-CM | POA: Diagnosis not present

## 2022-07-22 ENCOUNTER — Encounter: Payer: Self-pay | Admitting: Internal Medicine

## 2022-07-22 ENCOUNTER — Ambulatory Visit (INDEPENDENT_AMBULATORY_CARE_PROVIDER_SITE_OTHER): Payer: BC Managed Care – PPO | Admitting: Internal Medicine

## 2022-07-22 VITALS — BP 118/70 | HR 97 | Ht 68.5 in | Wt 212.0 lb

## 2022-07-22 DIAGNOSIS — J849 Interstitial pulmonary disease, unspecified: Secondary | ICD-10-CM | POA: Diagnosis not present

## 2022-07-22 DIAGNOSIS — Z8616 Personal history of COVID-19: Secondary | ICD-10-CM

## 2022-07-22 DIAGNOSIS — Z87891 Personal history of nicotine dependence: Secondary | ICD-10-CM | POA: Diagnosis not present

## 2022-07-22 DIAGNOSIS — R0609 Other forms of dyspnea: Secondary | ICD-10-CM | POA: Diagnosis not present

## 2022-07-22 NOTE — Patient Instructions (Addendum)
ICD-10-CM   1. DOE (dyspnea on exertion)  R06.09     2. ILD (interstitial lung disease) ([facility])  J84.9     3. History of 2019 novel coronavirus disease (COVID-19)  Z86.16     4. Stopped smoking with greater than 30 pack year history  Z87.891        - I am concerned MAY you  have Interstitial Lung Disease (ILD) - if so ? Started pre-covid in April 2022 and worse post covid in Many 2023   -  There are MANY varieties of this - To narrow down possibilities and assess severity please do the following tests  - do full PFT  - do walking test on room air in the office (not 6 min walk test) - next visit  - do overnight oxygen test on room air  - do High Resolution CT chest wo contrast - PRONE IMAGING ONLY - can cancel if insurance wont approve  - do autoimmune panel: Serum: ESR, ANA, DS-DNA, RF, anti-CCP, ssA, ssB, scl-70, Total CK,  Aldolase,  Hypersensitivity Pneumonitis Panel and Quantiferon gold   - continue daily exrecise and weight loss   - keep pulse ox >- 88%  Followup  -r eturn To see DR Chase Caller but afer completing all of above in next 4-8 weeks to discus next steps

## 2022-07-22 NOTE — Progress Notes (Signed)
OV 07/22/2022  Subjective:  Patient ID: Rebecca Huff, female , DOB: 09-07-1956 , age 66 y.o. , MRN: [id number] , AD[address]ESS: [address] [id number] PCP Harlan Stains, MD Patient Care Team: Harlan Stains, MD as PCP - General (Family Medicine)  This Provider for this visit: Treatment Team:  Attending Provider: Brand Males, MD    07/22/2022 -   Chief Complaint  Patient presents with   Consult    Pt had a recent CT performed which is the reason for today's visit. Pt states she has an occasional tickle in her throat and states the only time she has any SOB is when she goes upstairs.     HPI Rebecca Huff 66 y.o. -referred by Dr. [facility].  Patient has been getting annual CT scans low-dose CT scan of the chest for lung cancer screening since 2018.  She reports that the most recent one in May 2023 was reported as abnormal and therefore she had a high-resolution CT chest and has been referred here.  However my personal visualization I believe there was onset of potential ILD versus bibasilar atelectasis in April 2022.  In November 2022 she suffered COVID-19.  She states the symptoms were quite severe and she was coughing for 3 weeks.  She did take antiviral.  She was not hospitalized.  Then after that she started developing Hartness of breath particular for the last 6 to 8 months.  She did have a CT scan of the chest in May 2023 low-dose where in my personal visualization of the ILD changes in the lung base look more prominent.  This was followed by high-resolution CT chest that I personally visualized.  Only supine images.  Is not prone.  She is overweight and has gained 7 pounds in the last few years.  Again shows more prominent ILD changes versus bibasal atelectasis.  Definitely she is got shortness of breath in the last 6-8 months but she feels it is stable and is only mild and it is first-aid case.   [facility] Integrated Comprehensive  ILD Questionnaire  Symptoms:   SYMPTOM SCALE - ILD 07/22/2022  Current weight   O2 use ra  Shortness of Breath 0 -> 5 scale with 5 being worst (score 6 If unable to do)  At rest 0  Simple tasks - showers, clothes change, eating, shaving 0  Household (dishes, doing bed, laundry) 1  Shopping 0  Walking level at own pace 0  Walking up Stairs 2  Total (30-36) Dyspnea Score 3      Non-dyspnea symptoms (0-> 5 scale) 07/22/2022  How bad is your cough? 0  How bad is your fatigue 000  How bad is nausea 0  How bad is vomiting?  00  How bad is diarrhea? 0  How bad is anxiety? 0  How bad is depression 0  Any chronic pain - if so where and how bad 0     Past Medical History :  -Has osteoarthritis.  Shoulder pain and neck pain -No collagen vascular disease - Did have outpatient COVID in November 2022 - No kidney disease no heart disease no lung disease otherwise.  No blood clots no Lucy   ROS:  -Nonspecific joint pain with arthralgia consistent with osteoarthritis - Has history of snoring for the last few decades  FAMILY HISTORY of LUNG DISEASE:  Denies*  PERSONAL EXPOSURE HISTORY:  Smoke cigarettes 50 1977 in 20 1720 cigarettes/day and  quit.  No marijuana use no cocaine use no intravenous drug use.  HOME  EXPOSURE and HOBBY DETAILS :  [place]  Has been living there for 6 years.  Detail organic antigen exposure history in the house is negative.  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 Concern for interstitial lung disease on recent screening chest CT.   EXAM: CT CHEST WITHOUT CONTRAST   TECHNIQUE: Multidetector CT imaging of the chest was performed following the standard protocol without intravenous contrast. High resolution imaging of the lungs, as well as inspiratory and expiratory imaging, was performed.   RADIATION DOSE REDUCTION: This exam was performed according to the departmental dose-optimization program which includes automated exposure control, adjustment of the mA and/or kV according to patient size and/or use of iterative reconstruction technique.   COMPARISON:  04/04/2022 screening chest CT.   FINDINGS: Cardiovascular: Normal heart size. No significant pericardial effusion/thickening. Atherosclerotic nonaneurysmal thoracic aorta. Normal caliber pulmonary arteries.   Mediastinum/Nodes: Hypodense 1.3 cm right thyroid nodule is unchanged. Not clinically significant; no follow-up imaging recommended (ref: J Am Coll Radiol. 2015 Feb;12(2): 143-50). Unremarkable esophagus. No pathologically enlarged axillary, mediastinal or hilar lymph nodes, noting limited sensitivity for the detection of hilar adenopathy on this noncontrast study.   Lungs/Pleura: No pneumothorax. No pleural effusion. No acute consolidative airspace disease, lung masses or significant pulmonary nodules. Calcified 4 mm granuloma in the basilar right lower lobe is unchanged. Very mild patchy air trapping in both lungs on the expiration sequence without definite evidence of tracheobronchomalacia. Mild-to-moderate patchy confluent subpleural reticulation and ground-glass opacity in both lungs with a strong posterior lower lobe predominance. No significant regions of traction bronchiectasis, architectural distortion or frank honeycombing. Findings have increased since the baseline 05/08/2017 screening chest CT study and appear mildly  increased since 02/27/2021 chest CT.   Upper abdomen: Cholelithiasis.   Musculoskeletal: No aggressive appearing focal osseous lesions. Mild thoracic spondylosis.   IMPRESSION: 1. Mild-to-moderate patchy confluent subpleural reticulation and ground-glass opacity in both lungs with a strong posterior lower lobe predominance. No significant traction bronchiectasis or frank honeycombing. Findings have increased since the baseline 2018 screening chest CT study and appear mildly increased since 02/27/2021 chest CT. Very mild patchy air trapping in both lungs, indicative of small airways disease. Findings are considered indeterminate for interstitial lung disease such as NSIP or UIP versus a combination of hypoventilatory change and nonspecific bland postinfectious/postinflammatory scarring. Follow-up high-resolution chest CT in 12 months with prone imaging may be considered. Findings are indeterminate for UIP per consensus guidelines: Diagnosis of Idiopathic Pulmonary Fibrosis: An Official ATS/ERS/JRS/ALAT Clinical Practice Guideline. [place], Iss 5, [phone], Jul 29 2017. 2. Cholelithiasis. 3. Aortic Atherosclerosis (ICD10-I70.0).     Electronically Signed   By: Ilona Sorrel M.D.   On: 06/09/2022 09:36        has a past medical history of Anxiety, Arthritis, Cancer ([facility]), Depression, and GERD (gastroesophageal reflux disease).  reports that she quit smoking about 6 years ago. Her smoking use included cigarettes. She has a 35.00 pack-year smoking history. She has never used smokeless tobacco.  Past Surgical History:  Procedure Laterality Date   ABDOMINAL HYSTERECTOMY     CESAREAN SECTION     x 2   DILATION AND CURETTAGE OF UTERUS     JOINT REPLACEMENT     MENISCUS REPAIR     left  knee   TOTAL KNEE ARTHROPLASTY Left 10/29/2013   Dr [name]   TOTAL KNEE ARTHROPLASTY Left 10/29/2013   Procedure: LEFT TOTAL KNEE ARTHROPLASTY;  Surgeon: Garald Balding, MD;  Location: [facility];  Service: Orthopedics;  Laterality: Left;   TOTAL KNEE ARTHROPLASTY Right 04/21/2015   Procedure: TOTAL KNEE ARTHROPLASTY;  Surgeon: Garald Balding, MD;  Location: [facility];  Service: Orthopedics;  Laterality: Right;   TUBAL LIGATION      No Known Allergies  Immunization History  Administered Date(s) Administered   Influenza,inj,Quad PF,6+ Mos 07/29/2021    Family History  Problem Relation Age of Onset   Breast cancer Sister 49     Current Outpatient Medications:    aspirin EC 81 MG tablet, Take 81 mg by mouth daily. Swallow whole., Disp: , Rfl:    atorvastatin (LIPITOR) 20 MG tablet, SMARTSIG:1 Tablet(s) By Mouth Every Evening, Disp: , Rfl:    Coenzyme Q10 (COQ-10) 400 MG CAPS, Take 1 capsule by mouth daily., Disp: , Rfl:    diclofenac (VOLTAREN) 75 MG EC tablet, Take 75 mg by mouth 2 (two) times daily., Disp: , Rfl:    gabapentin (NEURONTIN) 300 MG capsule, Take 300 mg by mouth daily., Disp: , Rfl:    LORazepam (ATIVAN) 0.5 MG tablet, Take 0.75 mg by mouth at bedtime as needed for sleep., Disp: , Rfl:    Magnesium 400 MG CAPS, Take by mouth., Disp: , Rfl:    Multiple Vitamins-Minerals (MULTIVITAMIN WITH MINERALS) tablet, Take 1 tablet by mouth daily., Disp: , Rfl:    pyridOXINE (VITAMIN B6) 100 MG tablet, Take 100 mg by mouth daily., Disp: , Rfl:    sertraline (ZOLOFT) 50 MG tablet, Take 50 mg by mouth daily., Disp: , Rfl:       Objective:   Vitals:   07/22/22 1624  BP: 118/70  Pulse: 97  SpO2: 97%  Weight: 212 lb (96.2 kg)  Height: 5' 8.5" (1.74 m)    Estimated body mass index is 31.77 kg/m as calculated from the following:   Height as of this encounter: 5' 8.5" (1.74 m).   Weight as of this encounter: 212 lb (96.2 kg).  '@WEIGHTCHANGE' @  [employer]   07/22/22 1624  Weight: 212 lb (96.2 kg)     Physical Exam  General Appearance:    Alert, cooperative, no distress, appears stated age - looks well , Deconditioned looking - no  , OBESE  - no, Sitting on Wheelchair -  no  Head:    Normocephalic, without obvious abnormality, atraumatic  Eyes:    PERRL, conjunctiva/corneas clear,  Ears:    Normal TM's and external ear canals, both ears  Nose:   Nares normal, septum midline, mucosa normal, no drainage    or sinus tenderness. OXYGEN ON  - no . Patient is @ ra   Throat:   Lips, mucosa, and tongue normal; teeth and gums normal. Cyanosis on lips - no  Neck:   Supple, symmetrical, trachea midline, no adenopathy;    thyroid:  no enlargement/tenderness/nodules; no carotid  bruit or JVD  Back:     Symmetric, no curvature, ROM normal, no CVA tenderness  Lungs:     Distress - no , Wheeze no, Barrell Chest - no, Purse lip breathing - no, Crackles - YES BASE   Chest Wall:    No tenderness or deformity.    Heart:    Regular rate and rhythm, S1 and S2 normal, no rub   or gallop, Murmur - no  Breast Exam:    NOT DONE  Abdomen:     Soft, non-tender, bowel sounds active all four quadrants,    no masses, no organomegaly. Visceral obesity - yes  Genitalia:   NOT DONE  Rectal:   NOT DONE  Extremities:   Extremities - normal, Has Cane - no, Clubbing - no, Edema - no  Pulses:   2+ and symmetric all extremities  Skin:   Stigmata of Connective Tissue Disease - no  Lymph nodes:   Cervical, supraclavicular, and axillary nodes normal  Psychiatric:  Neurologic:   Pleasant - yes, Anxious - n, Flat affect - no  CAm-ICU - neg, Alert and Oriented x 3 - yes, Moves all 4s - yes, Speech - normal, Cognition - intact         Assessment:       ICD-10-CM   1. DOE (dyspnea on exertion)  R06.09 Pulmonary Function Test    CT Chest High Resolution    2. ILD (interstitial lung disease) ([facility])  J84.9 Pulmonary Function Test    Pulse oximetry, overnight    CT Chest High Resolution    Sed Rate (ESR)    ANA+ENA+DNA/DS+Scl 25+ENIDPO/E    Cyclic citrul peptide antibody, IgG    QuantiFERON-TB Gold Plus    Hypersensitivity Pneumonitis    Aldolase     CK Total (and CKMB)    3. History of 2019 novel coronavirus disease (COVID-19)  Z86.16 Pulmonary Function Test    4. Stopped smoking with greater than 30 pack year history  Z87.891 Pulmonary Function Test         Plan:     Patient Instructions     ICD-10-CM   1. DOE (dyspnea on exertion)  R06.09     2. ILD (interstitial lung disease) ([facility])  J84.9     3. History of 2019 novel coronavirus disease (COVID-19)  Z86.16     4. Stopped smoking with greater than 30 pack year history  Z87.891        - I am concerned MAY you  have Interstitial Lung Disease (ILD) - if so ? Started pre-covid in April 2022 and worse post covid in Many 2023   -  There are MANY varieties of this - To narrow down possibilities and assess severity please do the following tests  - do full PFT  - do walking test on room air in the office (not 6 min walk test) - next visit  - do overnight oxygen test on room air  - do High Resolution CT chest wo contrast - PRONE IMAGING ONLY - can cancel if insurance wont approve  - do autoimmune panel: Serum: ESR, ANA, DS-DNA, RF, anti-CCP, ssA, ssB, scl-70, Total CK,  Aldolase,  Hypersensitivity Pneumonitis Panel and Quantiferon gold   - continue daily exrecise and weight loss   - keep pulse ox >- 88%  Followup  -r eturn To see DR Chase Caller but afer completing all of above in next 4-8 weeks to discus next steps     SIGNATURE  Dr. Brand Males, M.D., F.C.C.P,  Pulmonary and Critical Care Medicine Staff Physician, [facility] Director - Interstitial Lung Disease  Program  [facility] [place], [place], [address]  Pager: [phone], If no answer or between  15:00h - 7:00h: call [phone] Telephone: [phone]  5:47 PM 07/22/2022

## 2022-07-27 DIAGNOSIS — G5603 Carpal tunnel syndrome, bilateral upper limbs: Secondary | ICD-10-CM | POA: Diagnosis not present

## 2022-07-28 ENCOUNTER — Other Ambulatory Visit: Payer: Self-pay | Admitting: *Deleted

## 2022-07-28 ENCOUNTER — Other Ambulatory Visit (INDEPENDENT_AMBULATORY_CARE_PROVIDER_SITE_OTHER): Payer: BC Managed Care – PPO

## 2022-07-28 DIAGNOSIS — J849 Interstitial pulmonary disease, unspecified: Secondary | ICD-10-CM | POA: Diagnosis not present

## 2022-07-28 DIAGNOSIS — R0609 Other forms of dyspnea: Secondary | ICD-10-CM

## 2022-07-28 LAB — SEDIMENTATION RATE: Sed Rate: 6 mm/hr (ref 0–30)

## 2022-07-29 LAB — CK TOTAL AND CKMB (NOT AT ARMC)
CK, MB: <0.7 ng/mL (ref 0–5.0)
Total CK: 81 U/L (ref 29–143)

## 2022-07-30 LAB — CYCLIC CITRUL PEPTIDE ANTIBODY, IGG: Cyclic Citrullin Peptide Ab: <16 UNITS

## 2022-08-01 LAB — QUANTIFERON-TB GOLD PLUS
Mitogen-NIL: >10 IU/mL
NIL: 0.11 IU/mL
QuantiFERON-TB Gold Plus: NEGATIVE
TB1-NIL: 0.02 IU/mL
TB2-NIL: 0.04 IU/mL

## 2022-08-02 ENCOUNTER — Telehealth: Payer: Self-pay | Admitting: Internal Medicine

## 2022-08-03 LAB — ALDOLASE: Aldolase: 5 U/L (ref <=8.1)

## 2022-08-03 NOTE — Telephone Encounter (Signed)
Called and spoke with patient.  Patient stated she was contacted 08/02/22 by [employer] [employer] and over night oximetry machine is scheduled for her to pick up 08/08/22.  Nothing further at this time.

## 2022-08-04 LAB — HYPERSENSITIVITY PNEUMONITIS
A. Pullulans Abs: NEGATIVE
A.Fumigatus #1 Abs: NEGATIVE
Micropolyspora faeni, IgG: NEGATIVE
Pigeon Serum Abs: NEGATIVE
Thermoact. Saccharii: NEGATIVE
Thermoactinomyces vulgaris, IgG: NEGATIVE

## 2022-08-04 LAB — ANA+ENA+DNA/DS+SCL 70+SJOSSA/B
ANA Titer 1: NEGATIVE
ENA RNP Ab: <0.2 AI (ref 0.0–0.9)
ENA SM Ab Ser-aCnc: <0.2 AI (ref 0.0–0.9)
ENA SSA (RO) Ab: <0.2 AI (ref 0.0–0.9)
ENA SSB (LA) Ab: <0.2 AI (ref 0.0–0.9)
Scleroderma (Scl-70) (ENA) Antibody, IgG: <0.2 AI (ref 0.0–0.9)
dsDNA Ab: <1 IU/mL (ref 0–9)

## 2022-08-06 ENCOUNTER — Ambulatory Visit (HOSPITAL_BASED_OUTPATIENT_CLINIC_OR_DEPARTMENT_OTHER): Payer: BC Managed Care – PPO

## 2022-08-09 DIAGNOSIS — J849 Interstitial pulmonary disease, unspecified: Secondary | ICD-10-CM | POA: Diagnosis not present

## 2022-08-11 ENCOUNTER — Telehealth: Payer: Self-pay | Admitting: Internal Medicine

## 2022-08-11 ENCOUNTER — Encounter: Payer: Self-pay | Admitting: Internal Medicine

## 2022-08-11 DIAGNOSIS — I700 Atherosclerosis of aorta: Secondary | ICD-10-CM | POA: Diagnosis not present

## 2022-08-11 DIAGNOSIS — F325 Major depressive disorder, single episode, in full remission: Secondary | ICD-10-CM | POA: Diagnosis not present

## 2022-08-11 DIAGNOSIS — F419 Anxiety disorder, unspecified: Secondary | ICD-10-CM | POA: Diagnosis not present

## 2022-08-11 DIAGNOSIS — E785 Hyperlipidemia, unspecified: Secondary | ICD-10-CM | POA: Diagnosis not present

## 2022-08-11 NOTE — Telephone Encounter (Signed)
[place] on  MARZETTA LANZA Oct 05, 1956 Done 08/08/22  Is 23 min and 50 sec <= 88%   Plan 1L [place] QHS

## 2022-08-12 NOTE — Telephone Encounter (Signed)
[employer] rep came to the office 08/11/22 with form. Form filled out and signed by MR and handed back to rep. Pt also aware of the results. Nothing further needed.

## 2022-08-13 ENCOUNTER — Ambulatory Visit (HOSPITAL_BASED_OUTPATIENT_CLINIC_OR_DEPARTMENT_OTHER)
Admission: RE | Admit: 2022-08-13 | Discharge: 2022-08-13 | Disposition: A | Discharge status: Home / Self Care | Payer: BC Managed Care – PPO | Source: Ambulatory Visit | Attending: Internal Medicine | Admitting: Internal Medicine

## 2022-08-13 DIAGNOSIS — R0609 Other forms of dyspnea: Secondary | ICD-10-CM | POA: Insufficient documentation

## 2022-08-13 DIAGNOSIS — J849 Interstitial pulmonary disease, unspecified: Secondary | ICD-10-CM | POA: Diagnosis not present

## 2022-08-16 DIAGNOSIS — J849 Interstitial pulmonary disease, unspecified: Secondary | ICD-10-CM | POA: Diagnosis not present

## 2022-08-17 NOTE — Telephone Encounter (Signed)
Yes this is fine and good idea. WE can retest in a few months or sevral weeks. Can address at followup

## 2022-08-22 DIAGNOSIS — M180 Bilateral primary osteoarthritis of first carpometacarpal joints: Secondary | ICD-10-CM | POA: Diagnosis not present

## 2022-08-22 DIAGNOSIS — G5603 Carpal tunnel syndrome, bilateral upper limbs: Secondary | ICD-10-CM | POA: Diagnosis not present

## 2022-09-06 ENCOUNTER — Ambulatory Visit (INDEPENDENT_AMBULATORY_CARE_PROVIDER_SITE_OTHER): Payer: BC Managed Care – PPO | Admitting: Internal Medicine

## 2022-09-06 DIAGNOSIS — R0609 Other forms of dyspnea: Secondary | ICD-10-CM | POA: Diagnosis not present

## 2022-09-06 DIAGNOSIS — L723 Sebaceous cyst: Secondary | ICD-10-CM | POA: Diagnosis not present

## 2022-09-06 DIAGNOSIS — J849 Interstitial pulmonary disease, unspecified: Secondary | ICD-10-CM

## 2022-09-06 DIAGNOSIS — L821 Other seborrheic keratosis: Secondary | ICD-10-CM | POA: Diagnosis not present

## 2022-09-06 DIAGNOSIS — L814 Other melanin hyperpigmentation: Secondary | ICD-10-CM | POA: Diagnosis not present

## 2022-09-06 DIAGNOSIS — Z87891 Personal history of nicotine dependence: Secondary | ICD-10-CM

## 2022-09-06 DIAGNOSIS — D225 Melanocytic nevi of trunk: Secondary | ICD-10-CM | POA: Diagnosis not present

## 2022-09-06 DIAGNOSIS — Z8616 Personal history of COVID-19: Secondary | ICD-10-CM

## 2022-09-06 LAB — PULMONARY FUNCTION TEST
DL/VA % pred: 107 %
DL/VA: 4.36 ml/min/mmHg/L
DLCO cor % pred: 81 %
DLCO cor: 18.86 ml/min/mmHg
DLCO unc % pred: 81 %
DLCO unc: 18.86 ml/min/mmHg
FEF 25-75 Post: 3.47 L/sec
FEF 25-75 Pre: 2.65 L/sec
FEF2575-%Change-Post: 30 %
FEF2575-%Pred-Post: 141 %
FEF2575-%Pred-Pre: 108 %
FEV1-%Change-Post: 6 %
FEV1-%Pred-Post: 86 %
FEV1-%Pred-Pre: 80 %
FEV1-Post: 2.48 L
FEV1-Pre: 2.33 L
FEV1FVC-%Change-Post: 5 %
FEV1FVC-%Pred-Pre: 106 %
FEV6-%Change-Post: 1 %
FEV6-%Pred-Post: 78 %
FEV6-%Pred-Pre: 78 %
FEV6-Post: 2.86 L
FEV6-Pre: 2.82 L
FEV6FVC-%Pred-Post: 103 %
FEV6FVC-%Pred-Pre: 103 %
FVC-%Change-Post: 1 %
FVC-%Pred-Post: 76 %
FVC-%Pred-Pre: 75 %
FVC-Post: 2.87 L
FVC-Pre: 2.82 L
Post FEV1/FVC ratio: 87 %
Post FEV6/FVC ratio: 100 %
Pre FEV1/FVC ratio: 82 %
Pre FEV6/FVC Ratio: 100 %
RV % pred: 73 %
RV: 1.69 L
TLC % pred: 88 %
TLC: 5.06 L

## 2022-09-06 NOTE — Patient Instructions (Signed)
Full PFT performed today. °

## 2022-09-06 NOTE — Progress Notes (Signed)
Full PFT performed today. °

## 2022-09-07 ENCOUNTER — Encounter: Payer: Self-pay | Admitting: Nurse Practitioner

## 2022-09-07 ENCOUNTER — Ambulatory Visit (INDEPENDENT_AMBULATORY_CARE_PROVIDER_SITE_OTHER): Payer: Self-pay | Admitting: Nurse Practitioner

## 2022-09-07 VITALS — BP 110/68 | HR 76 | Temp 98.2°F | Ht 68.5 in | Wt 208.8 lb

## 2022-09-07 DIAGNOSIS — G4719 Other hypersomnia: Secondary | ICD-10-CM

## 2022-09-07 DIAGNOSIS — J84112 Idiopathic pulmonary fibrosis: Secondary | ICD-10-CM

## 2022-09-07 DIAGNOSIS — J432 Centrilobular emphysema: Secondary | ICD-10-CM

## 2022-09-07 DIAGNOSIS — R0683 Snoring: Secondary | ICD-10-CM

## 2022-09-07 MED ORDER — SPIRIVA RESPIMAT 2.5 MCG/ACT IN AERS: 2.0000 | INHALATION_SPRAY | Freq: Every day | RESPIRATORY_TRACT | 0 refills | Status: DC

## 2022-09-07 NOTE — Assessment & Plan Note (Addendum)
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 She has maintained normal lung capacity and diffusion capacity on PFTs. We reviewed the diagnosis of IPF and progression of the disease. We also discussed the role of antifibrotic therapy; although, disease if very mild so not clear that this would be warranted at this point. Could consider watchful waiting as well but educated her that the speed of progression is unpredictable. Will discuss with Dr. Chase Caller. Walking oximetry today without desaturation on room air.   Patient Instructions  Continue supplemental oxygen 1 lpm at night  Start Spiriva 2 puffs daily   Given your symptoms and drops in oxygen at night, I am concerned that you may have a component of obstructive sleep apnea. I have ordered an in lab study for further evaluation. Someone will contact your for scheduling.  We discussed pulmonary fibrosis today and the progression of the disease. We also discussed potential options including watchful waiting and antifibrotics. I will discuss further with Dr. Chase Caller and then you can review at your appointment next week.  Follow up next week with Dr. Chase Caller as scheduled or sooner if needed

## 2022-09-07 NOTE — Patient Instructions (Signed)
Continue supplemental oxygen 1 lpm at night  Start Spiriva 2 puffs daily   Given your symptoms and drops in oxygen at night, I am concerned that you may have a component of obstructive sleep apnea. I have ordered an in lab study for further evaluation. Someone will contact your for scheduling.  We discussed pulmonary fibrosis today and the progression of the disease. We also discussed potential options including watchful waiting and antifibrotics. I will discuss further with Dr. Chase Caller and then you can review at your appointment next week.  Follow up next week with Dr. Chase Caller as scheduled or sooner if needed

## 2022-09-07 NOTE — Assessment & Plan Note (Signed)
She has snoring, excessive daytime sleepiness, nocturnal hypoxemia, morning headaches. BMI 31. Given this,  I am concerned she could have sleep disordered breathing with obstructive sleep apnea. She will need sleep study for further evaluation and given her history of nocturnal hypoxemia, we will move forward with in lab split night study.   - discussed how weight can impact sleep and risk for sleep disordered breathing - discussed options to assist with weight loss: combination of diet modification, cardiovascular and strength training exercises   - had an extensive discussion regarding the adverse health consequences related to untreated sleep disordered breathing - specifically discussed the risks for hypertension, coronary artery disease, cardiac dysrhythmias, cerebrovascular disease, and diabetes - lifestyle modification discussed   - discussed how sleep disruption can increase risk of accidents, particularly when driving - safe driving practices were discussed

## 2022-09-07 NOTE — Progress Notes (Signed)
$'@Patient'C$  ID: Rebecca Huff, female    DOB: 05/27/1956, 66 y.o.   MRN: [id number]  Chief Complaint  Patient presents with   Follow-up    Discuss labs/PFT-sob-same and with "hurrying around"    Referring provider: Harlan Stains, MD  HPI: 66 year old female, former smoker for ILD.  She has a patient Rebecca Huff and last seen in office for initial pulmonary consult 07/22/2022.  Past medical history significant for osteoarthritis and HLD.   TEST/EVENTS:  07/28/2022: Negative autoimmune panel 08/03/2022 HRCT chest: Mild pulmonary parenchymal pattern of ILD, categorized as probable UIP.  Enlarged right and left pulmonary arteries, consistent with pulmonary arterial hypertension. 08/08/2022 ONO 23 min spent <88%; average 91.6% and SpO2 low 81% 09/06/2022 PFTs: FVC 75, FEV1 80, ratio 87, TLC 88, DLCOcor 81  07/22/2022: OV with Rebecca Huff for initial pulmonary visit.  She was referred by [facility] with [facility].  She has been getting annual lung cancer screening CT scans since 2018.  Most recent one in May 2023 was reported as abnormal and therefore she had a high-resolution CT chest and has been referred here.  Seems that there was an onset of potential ILD versus bibasilar atelectasis in April 2022.  In November 2022 she suffered COVID-19.  She states that the symptoms were quite severe and she was coughing for 3 weeks.  Did take antiviral.  Was not hospitalized.  Started having shortness of breath, particularly over the last 6 to 8 months.  The ILD changes in the lung base look more prominent.  No prone imaging on HRCT.  Concerned she may have ILD.  Question whether it started pre-COVID with worsening after COVID.  PFT, Rebecca Huff, HRCT prone only and ILD panel ordered for further evaluation.  09/07/2022: Today - follow up Patient presents today for follow-up to discuss pulmonary function testing and high-resolution CT scan results.  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  Her pulmonary  function testing was overall normal with no formal restrictive or obstructive defect.  She reports that she has been stable since we saw her last.  No increased shortness of breath from her baseline.  She really only gets winded when she gets in a hurry and is doing multiple different things at 1 time.  Otherwise, she does not really notice any any negative impact on her breathing.  She walks for exercise on a regular basis.  Denies any significant cough, chest congestion, wheezing.  She did have some findings of enlarged pulmonary artery on her CT scan.  DLCO was normal and pulmonary function testing.  She denies any lower extremity swelling, PND or orthopnea. She tells me that she does have some trouble with sleeping at night.  After she was told that she had drops in her oxygen, she stopped using her Xanax and gabapentin.  She wakes frequently throughout the night.  She also feels like she does not sleep as soundly.  She has been told in the past that she snores.  Wakes up in the morning feeling poorly rested and has daytime fatigue symptoms.  She also has morning headaches.  She denies any witnessed apneas, sleep parasomnia/paralysis, or history of narcolepsy or cataplexy.  She is wearing 1 L supplemental O2 at night; does not like wearing it.  Wants to know if she can come off of it.  No Known Allergies  Immunization History  Administered Date(s) Administered   Influenza,inj,Quad PF,6+ Mos 07/29/2021    Past Medical History:  Diagnosis Date  Anxiety    Arthritis    Cancer ([place])    skin cancer on anterior left leg   Depression    GERD (gastroesophageal reflux disease)    every now and then and then she takes tums    Tobacco History: Social History   Tobacco Use  Smoking Status Former   Packs/day: 1.00   Years: 35.00   Total pack years: 35.00   Types: Cigarettes   Quit date: 2017   Years since quitting: 6.7  Smokeless Tobacco Never   Counseling given: Not  Answered   Outpatient Medications Prior to Visit  Medication Sig Dispense Refill   aspirin EC 81 MG tablet Take 81 mg by mouth daily. Swallow whole.     atorvastatin (LIPITOR) 20 MG tablet SMARTSIG:1 Tablet(s) By Mouth Every Evening     Coenzyme Q10 (COQ-10) 400 MG CAPS Take 1 capsule by mouth daily.     diclofenac (VOLTAREN) 75 MG EC tablet Take 75 mg by mouth 2 (two) times daily.     Magnesium 400 MG CAPS Take by mouth.     Multiple Vitamins-Minerals (MULTIVITAMIN WITH MINERALS) tablet Take 1 tablet by mouth daily.     pyridOXINE (VITAMIN B6) 100 MG tablet Take 100 mg by mouth daily.     sertraline (ZOLOFT) 50 MG tablet Take 50 mg by mouth daily.     gabapentin (NEURONTIN) 300 MG capsule Take 300 mg by mouth daily. (Patient not taking: Reported on 09/07/2022)     LORazepam (ATIVAN) 0.5 MG tablet Take 0.75 mg by mouth at bedtime as needed for sleep. (Patient not taking: Reported on 09/07/2022)     No facility-administered medications prior to visit.     Review of Systems:   Constitutional: No weight loss or gain, night sweats, fevers, chills,  or lassitude. +fatigue HEENT: No headaches, difficulty swallowing, tooth/dental problems, or sore throat. No sneezing, itching, ear ache, nasal congestion, or post nasal drip CV:  No chest pain, orthopnea, PND, swelling in lower extremities, anasarca, dizziness, palpitations, syncope Resp: +shortness of breath with exertion; snoring. No excess mucus or change in color of mucus. No productive or non-productive. No hemoptysis. No wheezing.  No chest wall deformity GI:  No heartburn, indigestion, abdominal pain, nausea, vomiting, diarrhea, change in bowel habits, loss of appetite, bloody stools.  MSK:  No joint pain or swelling.  No decreased range of motion.  No back pain. Neuro: No dizziness or lightheadedness.  Psych: No depression or anxiety. Mood stable. +sleep disturbance    Physical Exam:  BP 110/68 (BP Location: Right Arm, Cuff Size:  Normal)   Pulse 76   Temp 98.2 F (36.8 C) (Temporal)   Ht 5' 8.5" (1.74 m)   Wt 208 lb 12.8 oz (94.7 kg)   SpO2 98%   BMI 31.29 kg/m   GEN: Pleasant, interactive, well-appearing; obese; in no acute distress. HEENT:  Normocephalic and atraumatic. PERRLA. Sclera white. Nasal turbinates pink, moist and patent bilaterally. No rhinorrhea present. Oropharynx pink and moist, without exudate or edema. No lesions, ulcerations, or postnasal drip. Mallampati II NECK:  Supple w/ fair ROM. No JVD present. Normal carotid impulses w/o bruits. Thyroid symmetrical with no goiter or nodules palpated. No lymphadenopathy.   CV: RRR, no m/r/g, no peripheral edema. Pulses intact, +2 bilaterally. No cyanosis, pallor or clubbing. PULMONARY:  Unlabored, regular breathing. Clear bilaterally A&P w/o wheezes/rales/rhonchi. No accessory muscle use.  GI: BS present and normoactive. Soft, non-tender to palpation. No organomegaly or masses detected. MSK: No erythema,  warmth or tenderness. No deformities or joint swelling noted.  Neuro: A/Ox3. No focal deficits noted.   Skin: Warm, no lesions or rashe Psych: Normal affect and behavior. Judgement and thought content appropriate.     Lab Results:  CBC    Component Value Date/Time   WBC 10.7 (H) 04/23/2015 0505   RBC 3.80 (L) 04/23/2015 0505   HGB 11.7 (L) 04/23/2015 0505   HCT 34.6 (L) 04/23/2015 0505   PLT 193 04/23/2015 0505   MCV 91.1 04/23/2015 0505   MCH 30.8 04/23/2015 0505   MCHC 33.8 04/23/2015 0505   RDW 12.3 04/23/2015 0505   LYMPHSABS 3.8 04/09/2015 1346   MONOABS 0.5 04/09/2015 1346   EOSABS 0.1 04/09/2015 1346   BASOSABS 0.1 04/09/2015 1346    BMET    Component Value Date/Time   NA 134 (L) 04/23/2015 0505   K 4.2 04/23/2015 0505   CL 100 (L) 04/23/2015 0505   CO2 29 04/23/2015 0505   GLUCOSE 151 (H) 04/23/2015 0505   BUN <5 (L) 04/23/2015 0505   CREATININE 0.67 04/23/2015 0505   CALCIUM 8.6 (L) 04/23/2015 0505   GFRNONAA >60  04/23/2015 0505   GFRAA >60 04/23/2015 0505    BNP No results found for: "BNP"   Imaging:  CT Chest High Resolution  Result Date: 08/15/2022 CLINICAL DATA:  Interstitial lung disease. Only prone imaging requested. Shortness of breath with exertion. EXAM: CT CHEST WITHOUT CONTRAST TECHNIQUE: Multidetector CT imaging of the chest was performed following the standard protocol without intravenous contrast. High resolution imaging of the lungs, as well as inspiratory and expiratory imaging, was performed. RADIATION DOSE REDUCTION: This exam was performed according to the departmental dose-optimization program which includes automated exposure control, adjustment of the mA and/or kV according to patient size and/or use of iterative reconstruction technique. COMPARISON:  06/07/2022, 04/04/2022, 02/27/2021. FINDINGS: Cardiovascular: Atherosclerotic calcification of the aorta and aortic valve. Enlarged right and left pulmonary arteries. Heart size at the upper limits of normal. No pericardial or pleural effusion. Distal esophagus is unremarkable. Mediastinum/Nodes: No pathologically enlarged mediastinal or axillary lymph nodes. Hilar regions are difficult to definitively evaluate without IV contrast. Esophagus is grossly unremarkable. Lungs/Pleura: Mild peripheral and basilar predominant subpleural reticulation, ground-glass and traction bronchiectasis/bronchiolectasis. Findings were present on prior exams but 1:1 comparison is challenging due to prone imaging on today's study. No pleural fluid. Mild nodularity along the ventral and lateral walls of the trachea can be seen with tracheobronchopathia osteochondroplastica. No air trapping. Upper Abdomen: Visualized portion of the liver is unremarkable. Stones in the gallbladder. Visualized portions of the adrenal glands, left kidney, spleen, pancreas, stomach and bowel are grossly unremarkable. Musculoskeletal: Degenerative changes in the spine. No worrisome lytic  or sclerotic lesions. IMPRESSION: 1. 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 Findings are categorized as probable UIP per consensus guidelines: Diagnosis of Idiopathic Pulmonary Fibrosis: An Official ATS/ERS/JRS/ALAT Clinical Practice Guideline. [place], Iss 5, [phone], Jul 29 2017. 2. Cholelithiasis. 3.  Aortic atherosclerosis (ICD10-I70.0). 4. Enlarged right and left pulmonary arteries, indicative of pulmonary arterial hypertension. Electronically Signed   By: Lorin Picket M.D.   On: 08/15/2022 14:04         Latest Ref Rng & Units 09/06/2022    9:04 AM  PFT Results  FVC-Pre L 2.82  P  FVC-Predicted Pre % 75  P  FVC-Post L 2.87  P  FVC-Predicted Post % 76  P  Pre FEV1/FVC % % 82  P  Post FEV1/FCV % % 87  P  FEV1-Pre L 2.33  P  FEV1-Predicted Pre % 80  P  FEV1-Post L 2.48  P  DLCO uncorrected ml/min/mmHg 18.86  P  DLCO UNC% % 81  P  DLCO corrected ml/min/mmHg 18.86  P  DLCO COR %Predicted % 81  P  DLVA Predicted % 107  P  TLC L 5.06  P  TLC % Predicted % 88  P  RV % Predicted % 73  P    P Preliminary result    No results found for: "NITRICOXIDE"      Assessment & Plan:   IPF (idiopathic pulmonary fibrosis) ([place]) She has very mild changes on HRCT, in probable UIP pattern and consistent with IPF diagnosis given progression over the years,. She has maintained normal lung capacity and diffusion capacity on PFTs. We reviewed the diagnosis of IPF and progression of the disease. We also discussed the role of antifibrotic therapy; although, disease if very mild so not clear that this would be warranted at this point. Could consider watchful waiting as well but educated her that the speed of progression is unpredictable. Will discuss with Rebecca Huff. Walking oximetry today without desaturation on room air.   Patient Instructions  Continue supplemental oxygen 1 lpm at night  Start  Spiriva 2 puffs daily   Given your symptoms and drops in oxygen at night, I am concerned that you may have a component of obstructive sleep apnea. I have ordered an in lab study for further evaluation. Someone will contact your for scheduling.  We discussed pulmonary fibrosis today and the progression of the disease. We also discussed potential options including watchful waiting and antifibrotics. I will discuss further with Rebecca Huff and then you can review at your appointment next week.  Follow up next week with Rebecca Huff as scheduled or sooner if needed     Excessive daytime sleepiness She has snoring, excessive daytime sleepiness, nocturnal hypoxemia, morning headaches. BMI 31. Given this,  I am concerned she could have sleep disordered breathing with obstructive sleep apnea. She will need sleep study for further evaluation and given her history of nocturnal hypoxemia, we will move forward with in lab split night study.   - discussed how weight can impact sleep and risk for sleep disordered breathing - discussed options to assist with weight loss: combination of diet modification, cardiovascular and strength training exercises   - had an extensive discussion regarding the adverse health consequences related to untreated sleep disordered breathing - specifically discussed the risks for hypertension, coronary artery disease, cardiac dysrhythmias, cerebrovascular disease, and diabetes - lifestyle modification discussed   - discussed how sleep disruption can increase risk of accidents, particularly when driving - safe driving practices were discussed   Centrilobular emphysema (HCC) Mild emphysematous changes on imaging. No formal obstruction on PFTs. She does have DOE; likely multifactorial. We will try her on LAMA to see if she receives any benefit from use.    I spent 42 minutes of dedicated to the care of this patient on the date of this encounter to include pre-visit review of  records, face-to-face time with the patient discussing conditions above, post visit ordering of testing, clinical documentation with the electronic health record, making appropriate referrals as documented, and communicating necessary findings to members of the patients care team.  Clayton Bibles, NP 09/07/2022  Pt aware and understands NP's role.

## 2022-09-07 NOTE — Assessment & Plan Note (Signed)
Mild emphysematous changes on imaging. No formal obstruction on PFTs. She does have DOE; likely multifactorial. We will try her on LAMA to see if she receives any benefit from use.

## 2022-09-14 DIAGNOSIS — M1812 Unilateral primary osteoarthritis of first carpometacarpal joint, left hand: Secondary | ICD-10-CM | POA: Diagnosis not present

## 2022-09-14 DIAGNOSIS — G5602 Carpal tunnel syndrome, left upper limb: Secondary | ICD-10-CM | POA: Diagnosis not present

## 2022-09-14 DIAGNOSIS — G4733 Obstructive sleep apnea (adult) (pediatric): Secondary | ICD-10-CM | POA: Diagnosis not present

## 2022-09-14 DIAGNOSIS — F419 Anxiety disorder, unspecified: Secondary | ICD-10-CM | POA: Diagnosis not present

## 2022-09-15 ENCOUNTER — Encounter: Payer: Self-pay | Admitting: Internal Medicine

## 2022-09-15 ENCOUNTER — Ambulatory Visit (INDEPENDENT_AMBULATORY_CARE_PROVIDER_SITE_OTHER): Payer: BC Managed Care – PPO | Admitting: Internal Medicine

## 2022-09-15 VITALS — BP 122/68 | HR 70 | Temp 97.8°F | Ht 68.5 in | Wt 208.2 lb

## 2022-09-15 DIAGNOSIS — I288 Other diseases of pulmonary vessels: Secondary | ICD-10-CM | POA: Diagnosis not present

## 2022-09-15 DIAGNOSIS — Z87891 Personal history of nicotine dependence: Secondary | ICD-10-CM

## 2022-09-15 DIAGNOSIS — J849 Interstitial pulmonary disease, unspecified: Secondary | ICD-10-CM

## 2022-09-15 DIAGNOSIS — R0600 Dyspnea, unspecified: Secondary | ICD-10-CM | POA: Diagnosis not present

## 2022-09-15 NOTE — Progress Notes (Signed)
OV 07/22/2022  Subjective:  Patient ID: Rebecca Huff, female , DOB: [birth year] , age 66 y.o. , MRN: [id number] , AD[address]ESS: [address] [address] PCP Harlan Stains, MD Patient Care Team: Harlan Stains, MD as PCP - General (Family Medicine)  This Provider for this visit: Treatment Team:  Attending Provider: Brand Males, MD    07/22/2022 -   Chief Complaint  Patient presents with   Consult    Pt had a recent CT performed which is the reason for today's visit. Pt states she has an occasional tickle in her throat and states the only time she has any SOB is when she goes upstairs.     HPI Rebecca Huff 66 y.o. -referred by Dr. Aileen [facility].  Patient has been getting annual CT scans low-dose CT scan of the chest for lung cancer screening since 2018.  She reports that the most recent one in May 2023 was reported as abnormal and therefore she had a high-resolution CT chest and has been referred here.  However my personal visualization I believe there was onset of potential ILD versus bibasilar atelectasis in April 2022.  In November 2022 she suffered COVID-19.  She states the symptoms were quite severe and she was coughing for 3 weeks.  She did take antiviral.  She was not hospitalized.  Then after that she started developing Hartness of breath particular for the last 6 to 8 months.  She did have a CT scan of the chest in May 2023 low-dose where in my personal visualization of the ILD changes in the lung base look more prominent.  This was followed by high-resolution CT chest that I personally visualized.  Only supine images.  Is not prone.  She is overweight and has gained 7 pounds in the last few years.  Again shows more prominent ILD changes versus bibasal atelectasis.  Definitely she is got shortness of breath in the last 6-8 months but she feels it is stable and is only mild and it is first-aid case.   [facility] Comprehensive ILD  Questionnaire  Symptoms:   Past Medical History :  -Has osteoarthritis.  Shoulder pain and neck pain -No collagen vascular disease - Did have outpatient COVID in November 2022 - No kidney disease no heart disease no lung disease otherwise.  No blood clots no Lucy   ROS:  -Nonspecific joint pain with arthralgia consistent with osteoarthritis - Has history of snoring for the last few decades  FAMILY HISTORY of LUNG DISEASE:  Denies*  PERSONAL EXPOSURE HISTORY:  Smoke cigarettes 50 1977 in 20 1720 cigarettes/day and quit.  No marijuana use no cocaine use no intravenous drug use.  HOME  EXPOSURE and HOBBY DETAILS :  [place]  Has been living there for 6 years.  Detail organic antigen exposure history in the house is negative.  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 Concern for interstitial lung disease on  recent screening chest CT.   EXAM: CT CHEST WITHOUT CONTRAST   TECHNIQUE: Multidetector CT imaging of the chest was performed following the standard protocol without intravenous contrast. High resolution imaging of the lungs, as well as inspiratory and expiratory imaging, was performed.   RADIATION DOSE REDUCTION: This exam was performed according to the departmental dose-optimization program which includes automated exposure control, adjustment of the mA and/or kV according to patient size and/or use of iterative reconstruction technique.    COMPARISON:  04/04/2022 screening chest CT.   FINDINGS: Cardiovascular: Normal heart size. No significant pericardial effusion/thickening. Atherosclerotic nonaneurysmal thoracic aorta. Normal caliber pulmonary arteries.   Mediastinum/Nodes: Hypodense 1.3 cm right thyroid nodule is unchanged. Not clinically significant; no follow-up imaging recommended (ref: J Am Coll Radiol. 2015 Feb;12(2): 143-50). Unremarkable esophagus. No pathologically enlarged axillary, mediastinal or hilar lymph nodes, noting limited sensitivity for the detection of hilar adenopathy on this noncontrast study.   Lungs/Pleura: No pneumothorax. No pleural effusion. No acute consolidative airspace disease, lung masses or significant pulmonary nodules. Calcified 4 mm granuloma in the basilar right lower lobe is unchanged. Very mild patchy air trapping in both lungs on the expiration sequence without definite evidence of tracheobronchomalacia. Mild-to-moderate patchy confluent subpleural reticulation and ground-glass opacity in both lungs with a strong posterior lower lobe predominance. No significant regions of traction bronchiectasis, architectural distortion or frank honeycombing. Findings have increased since the baseline 05/08/2017 screening chest CT study and appear mildly increased since 02/27/2021 chest CT.   Upper abdomen: Cholelithiasis.   Musculoskeletal: No aggressive appearing focal osseous lesions. Mild thoracic spondylosis.   IMPRESSION: 1. Mild-to-moderate patchy confluent subpleural reticulation and ground-glass opacity in both lungs with a strong posterior lower lobe predominance. No significant traction bronchiectasis or frank honeycombing. Findings have increased since the baseline 2018 screening chest CT study and appear mildly increased since 02/27/2021 chest CT. Very mild patchy air trapping in both lungs, indicative of small airways disease. Findings are considered indeterminate for  interstitial lung disease such as NSIP or UIP versus a combination of hypoventilatory change and nonspecific bland postinfectious/postinflammatory scarring. Follow-up high-resolution chest CT in 12 months with prone imaging may be considered. Findings are indeterminate for UIP per consensus guidelines: Diagnosis of Idiopathic Pulmonary Fibrosis: An Official ATS/ERS/JRS/ALAT Clinical Practice Guideline. [place], Iss 5, [phone], Jul 29 2017. 2. Cholelithiasis. 3. Aortic Atherosclerosis (ICD10-I70.0).     Electronically Signed   By: Ilona Sorrel M.D.   On: 06/09/2022 09:36         09/07/2022: Today - follow up Patient presents today for follow-up to discuss pulmonary function testing and high-resolution CT scan results.  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  Her pulmonary function testing was overall normal with no formal restrictive or obstructive defect.  She reports that she has been stable since we saw her last.  No increased shortness of breath from her baseline.  She really only gets winded when she gets in a hurry and is doing multiple different things at 1 time.  Otherwise, she does not really notice any any negative impact on her breathing.  She walks for exercise on a regular basis.  Denies any significant cough, chest congestion, wheezing.  She did have some findings of enlarged pulmonary artery on her CT scan.  DLCO was normal and pulmonary function testing.  She denies any lower extremity swelling, PND or orthopnea. She tells me that she does have some trouble with sleeping at night.  After she was  told that she had drops in her oxygen, she stopped using her Xanax and gabapentin.  She wakes frequently throughout the night.  She also feels like she does not sleep as soundly.  She has been told in the past that she snores.  Wakes up in the morning feeling poorly rested and has daytime fatigue symptoms.  She also has morning headaches.  She denies any  witnessed apneas, sleep parasomnia/paralysis, or history of narcolepsy or cataplexy.  She is wearing 1 L supplemental O2 at night; does not like wearing it.  Wants to know if she can come off of it.  TEST/EVENTS:  07/28/2022: Negative autoimmune panel 08/03/2022 HRCT chest: Mild pulmonary parenchymal pattern of ILD, categorized as probable UIP.  Enlarged right and left pulmonary arteries, consistent with pulmonary arterial hypertension. 08/08/2022 ONO 23 min spent <88%; average 91.6% and SpO2 low 81% 09/06/2022 PFTs: FVC 75, FEV1 80, ratio 87, TLC 88, DLCOcor 81  OV 09/15/2022  Subjective:  Patient ID: Rebecca Huff, female , DOB: 19-Dec-1955 , age 69 y.o. , MRN: [id number] , [address]: [address] [address] PCP Harlan Stains, MD Patient Care Team: Harlan Stains, MD as PCP - General (Family Medicine)  This Provider for this visit: Treatment Team:  Attending Provider: Brand Males, MD    09/15/2022 -   Chief Complaint  Patient presents with   Follow-up    Discuss PFT from 09/06/22. Snoring improved.   ILD -subtle since 2017/2018 through 2023 with normal pulmonary function testing except for FVC reduction based on obesity.  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   Former smoker   -No description of emphysema on CT chest but on empiric Spiriva.  Arctic valve calcification on CT scan of the chest 2023 with enlarged pulmonary artery  HPI Rebecca Huff 65 y.o. -turns for follow-up.  She presents with her husband.  I am meeting him for the first time.  She tells me that she is doing well.  Nurse practitioner gave her empiric Spiriva.  She is not sure if it is helping her.  She thinks it might be helping her.  Symptom score seems somewhat better.  She is willing to stop it.  She is using nighttime oxygen but its  not helping its actually making her tired.  She wants to stop it.   She is here to discuss findings of the CT scan of the chest and work-up so far.  We visualized the CT scans all the way from 2018 through 2023.  In my personal professional opinion I do not think there is progression.  However the radiologist thinks there is progression.  I do agree with the findings probable UIP although it could easily be indeterminate for UIP.  She does have some crackles at the base.  Given age greater than 26 and crackles in definite ILD findings with subpleural reticulation there is some 20% chance of progression over the next few to several years.  If it is probable UIP then per ATS we can call this is IPF and be more confident about progression.  If it is indeterminate then she will require about lung biopsy.  I discussed these possibilities with her but she is somewhat reluctant to have biopsy at this point.  Also she is not too enthusiastic about starting antifibrotic treatment given the  side effect profile and the fact that she is feeling well right now.  Did discuss about the unpredictable nature of ILD's in the future risk of progression although it is reassuring that so far in the last 4 to 5 years even if there is progression her DLCO still normal.  We resolved that we will discuss this in the case conference and then make a decision.  In addition the CT scan of the chest shows aortic valve calcification and enlarged pulmonary arteries she has not had an echocardiogram before.  She has agreed to get this.  SYMPTOM SCALE - ILD 07/22/2022 09/15/2022 spiriva  Current weight    O2 use ra ra  Shortness of Breath 0 -> 5 scale with 5 being worst (score 6 If unable to do)   At rest 0 0  Simple tasks - showers, clothes change, eating, shaving 0 0  Household (dishes, doing bed, laundry) 1 0  Shopping 0 0  Walking level at own pace 0 0  Walking up Stairs 2 1  Total (30-36) Dyspnea Score 3 1      Non-dyspnea  symptoms (0-> 5 scale) 07/22/2022 09/15/2022   How bad is your cough? 0 0  How bad is your fatigue 000 0  How bad is nausea 0 0  How bad is vomiting?  00 0  How bad is diarrhea? 0 0  How bad is anxiety? 0 0  How bad is depression 0 2  Any chronic pain - if so where and how bad 0 0     CT Chest data - HRCT 08/13/22  Narrative & Impression  CLINICAL DATA:  Interstitial lung disease. Only prone imaging requested. Shortness of breath with exertion.   EXAM: CT CHEST WITHOUT CONTRAST   TECHNIQUE: Multidetector CT imaging of the chest was performed following the standard protocol without intravenous contrast. High resolution imaging of the lungs, as well as inspiratory and expiratory imaging, was performed.   RADIATION DOSE REDUCTION: This exam was performed according to the departmental dose-optimization program which includes automated exposure control, adjustment of the mA and/or kV according to patient size and/or use of iterative reconstruction technique.   COMPARISON:  06/07/2022, 04/04/2022, 02/27/2021.   FINDINGS: Cardiovascular: Atherosclerotic calcification of the aorta and aortic valve. Enlarged right and left pulmonary arteries. Heart size at the upper limits of normal. No pericardial or pleural effusion. Distal esophagus is unremarkable.   Mediastinum/Nodes: No pathologically enlarged mediastinal or axillary lymph nodes. Hilar regions are difficult to definitively evaluate without IV contrast. Esophagus is grossly unremarkable.   Lungs/Pleura: Mild peripheral and basilar predominant subpleural reticulation, ground-glass and traction bronchiectasis/bronchiolectasis. Findings were present on prior exams but 1:1 comparison is challenging due to prone imaging on today's study. No pleural fluid. Mild nodularity along the ventral and lateral walls of the trachea can be seen with tracheobronchopathia osteochondroplastica. No air trapping.   Upper Abdomen: Visualized  portion of the liver is unremarkable. Stones in the gallbladder. Visualized portions of the adrenal glands, left kidney, spleen, pancreas, stomach and bowel are grossly unremarkable.   Musculoskeletal: Degenerative changes in the spine. No worrisome lytic or sclerotic lesions.   IMPRESSION: 1. 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 Findings are categorized as probable UIP per consensus guidelines: Diagnosis of Idiopathic Pulmonary Fibrosis: An Official ATS/ERS/JRS/ALAT Clinical Practice Guideline. [place], Iss 5, [phone], Jul 29 2017. 2. Cholelithiasis. 3.  Aortic atherosclerosis (ICD10-I70.0). 4. Enlarged right  and left pulmonary arteries, indicative of pulmonary arterial hypertension.     Electronically Signed   By: Lorin Picket M.D.   On: 08/15/2022 14:04    No results found.    PFT     Latest Ref Rng & Units 09/06/2022    9:04 AM  PFT Results  FVC-Pre L 2.82  P  FVC-Predicted Pre % 75  P  FVC-Post L 2.87  P  FVC-Predicted Post % 76  P  Pre FEV1/FVC % % 82  P  Post FEV1/FCV % % 87  P  FEV1-Pre L 2.33  P  FEV1-Predicted Pre % 80  P  FEV1-Post L 2.48  P  DLCO uncorrected ml/min/mmHg 18.86  P  DLCO UNC% % 81  P  DLCO corrected ml/min/mmHg 18.86  P  DLCO COR %Predicted % 81  P  DLVA Predicted % 107  P  TLC L 5.06  P  TLC % Predicted % 88  P  RV % Predicted % 73  P    P Preliminary result       has a past medical history of Anxiety, Arthritis, Cancer ([place]), Depression, and GERD (gastroesophageal reflux disease).   reports that she quit smoking about 6 years ago. Her smoking use included cigarettes. She has a 35.00 pack-year smoking history. She has never used smokeless tobacco.  Past Surgical History:  Procedure Laterality Date   ABDOMINAL HYSTERECTOMY     CESAREAN SECTION     x 2   DILATION AND CURETTAGE OF UTERUS     JOINT REPLACEMENT     MENISCUS  REPAIR     left  knee   TOTAL KNEE ARTHROPLASTY Left 10/29/2013   Dr [name]   TOTAL KNEE ARTHROPLASTY Left 10/29/2013   Procedure: LEFT TOTAL KNEE ARTHROPLASTY;  Surgeon: Garald Balding, MD;  Location: [facility];  Service: Orthopedics;  Laterality: Left;   TOTAL KNEE ARTHROPLASTY Right 04/21/2015   Procedure: TOTAL KNEE ARTHROPLASTY;  Surgeon: Garald Balding, MD;  Location: [facility];  Service: Orthopedics;  Laterality: Right;   TUBAL LIGATION      No Known Allergies  Immunization History  Administered Date(s) Administered   Influenza,inj,Quad PF,6+ Mos 07/29/2021    Family History  Problem Relation Age of Onset   Breast cancer Sister 74     Current Outpatient Medications:    aspirin EC 81 MG tablet, Take 81 mg by mouth daily. Swallow whole., Disp: , Rfl:    atorvastatin (LIPITOR) 20 MG tablet, SMARTSIG:1 Tablet(s) By Mouth Every Evening, Disp: , Rfl:    Coenzyme Q10 (COQ-10) 400 MG CAPS, Take 1 capsule by mouth daily., Disp: , Rfl:    cyanocobalamin 100 MCG tablet, Take 100 mcg by mouth daily., Disp: , Rfl:    diclofenac (VOLTAREN) 75 MG EC tablet, Take 75 mg by mouth 2 (two) times daily., Disp: , Rfl:    LORazepam (ATIVAN) 0.5 MG tablet, Take 0.5 mg by mouth at bedtime., Disp: , Rfl:    Magnesium 400 MG CAPS, Take by mouth., Disp: , Rfl:    Multiple Vitamins-Minerals (MULTIVITAMIN WITH MINERALS) tablet, Take 1 tablet by mouth daily., Disp: , Rfl:    pyridOXINE (VITAMIN B6) 100 MG tablet, Take 100 mg by mouth daily., Disp: , Rfl:    sertraline (ZOLOFT) 50 MG tablet, Take 75 mg by mouth daily., Disp: , Rfl:    Tiotropium Bromide Monohydrate (SPIRIVA RESPIMAT) 2.5 MCG/ACT AERS, Inhale 2 puffs into the lungs daily., Disp: 4 each, Rfl: 0  gabapentin (NEURONTIN) 300 MG capsule, Take 300 mg by mouth daily. (Patient not taking: Reported on 09/07/2022), Disp: , Rfl:       Objective:   Vitals:   09/15/22 1612  BP: 122/68  Pulse: 70  Temp: 97.8 F (36.6 C)  TempSrc: Oral   SpO2: 96%  Weight: 208 lb 3.2 oz (94.4 kg)  Height: 5' 8.5" (1.74 m)    Estimated body mass index is 31.2 kg/m as calculated from the following:   Height as of this encounter: 5' 8.5" (1.74 m).   Weight as of this encounter: 208 lb 3.2 oz (94.4 kg).  '@WEIGHTCHANGE'$ @  Filed Weights   09/15/22 1612  Weight: 208 lb 3.2 oz (94.4 kg)     Physical Exam    General: No distress. Looks wll Neuro: Alert and Oriented x 3. GCS 15. Speech normal Psych: Pleasant Resp:  Barrel Chest - no.  Wheeze - no, Crackles - mild base, No overt respiratory distress CVS: Normal heart sounds. Murmurs - no Ext: Stigmata of Connective Tissue Disease - no HEENT: Normal upper airway. PEERL +. No post nasal drip        Assessment:       ICD-10-CM   1. ILD (interstitial lung disease) ([place])  J84.9     2. Stopped smoking with greater than 30 pack year history  Z87.891     3. 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 ECHOCARDIOGRAM COMPLETE    4. Dyspnea, unspecified type  R06.00 ECHOCARDIOGRAM COMPLETE         Plan:     Patient Instructions     ICD-10-CM   1. ILD (interstitial lung disease) ([place])  J84.9     2. Stopped smoking with greater than 30 pack year history  Z87.891     3. 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        There is evidence of very mild pulmonary fibrosis sinc 2017 -> 2023 Is so mild breathing test other than impact from weight is normal Unclear to me if is worse over time  There is some concern tht in the future it can get worse  - I am unable to predict this as of now  Noted that nocturnal oxygen therapies not helping you and it is actually making you tired  There is calcium deposit in aortic valve and rreport also says pulmonary arteries are enlarged  Plan  - will discuss in our MDD conference Nov/Dec 2023  - I lean towards biopsy to sort out the type of fibrosis and outlook but understand you ar reluctant  -On the other hand if in the conference today think  that things are getting worse we can discuss about starting therapy without biopsy  - we can decide on t all this after case conference - get echo next few to several weeks - non urgent - can stop nighttime oxygen  Follow-up - Video visit in after 12th December 2023 to discuss case conference findings and next steps.   ( Level 05 visit: Estb 40-54 min visit type: on-site physical face to visit  in total care time and counseling or/and coordination of care by this undersigned MD - Dr Brand Males. This includes one or more of the following on this same day 09/15/2022: pre-charting, chart review, note writing, documentation discussion of test results, diagnostic or treatment recommendations, prognosis, risks and benefits of management options, instructions, education, compliance or risk-factor reduction. It excludes time spent by the [facility] or office staff in the care  of the patient. Actual time 40 min)   SIGNATURE    Dr. Brand Males, M.D., F.C.C.P,  Pulmonary and Critical Care Medicine Staff Physician, [place] Director - Interstitial Lung Disease  Program  Pulmonary [facility] [place], [place], [address]  Pager: [phone], If no answer or between  15:00h - 7:00h: call [phone] Telephone: [phone]  5:30 PM 09/15/2022

## 2022-09-15 NOTE — Patient Instructions (Addendum)
ICD-10-CM   1. ILD (interstitial lung disease) ([place])  J84.9     2. Stopped smoking with greater than 30 pack year history  Z87.891     3. 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        There is evidence of very mild pulmonary fibrosis sinc 2017 -> 2023 Is so mild breathing test other than impact from weight is normal Unclear to me if is worse over time  There is some concern tht in the future it can get worse  - I am unable to predict this as of now  Noted that nocturnal oxygen therapies not helping you and it is actually making you tired  There is calcium deposit in aortic valve and rreport also says pulmonary arteries are enlarged  Plan  - will discuss in our MDD conference Nov/Dec 2023  - I lean towards biopsy to sort out the type of fibrosis and outlook but understand you ar reluctant  -On the other hand if in the conference today think that things are getting worse we can discuss about starting therapy without biopsy  - we can decide on t all this after case conference - get echo next few to several weeks - non urgent - can stop nighttime oxygen  Follow-up - Video visit in after 12th December 2023 to discuss case conference findings and next steps.

## 2022-09-22 DIAGNOSIS — M1811 Unilateral primary osteoarthritis of first carpometacarpal joint, right hand: Secondary | ICD-10-CM | POA: Diagnosis not present

## 2022-09-22 DIAGNOSIS — G5601 Carpal tunnel syndrome, right upper limb: Secondary | ICD-10-CM | POA: Diagnosis not present

## 2022-10-03 ENCOUNTER — Ambulatory Visit (INDEPENDENT_AMBULATORY_CARE_PROVIDER_SITE_OTHER): Payer: Self-pay

## 2022-10-03 DIAGNOSIS — I288 Other diseases of pulmonary vessels: Secondary | ICD-10-CM

## 2022-10-03 DIAGNOSIS — R0600 Dyspnea, unspecified: Secondary | ICD-10-CM

## 2022-10-03 LAB — ECHOCARDIOGRAM COMPLETE
Area-P 1/2: 3.6 cm2
MV M vel: 3.77 m/s
MV Peak grad: 56.9 mmHg
S' Lateral: 2.21 cm

## 2022-10-07 ENCOUNTER — Encounter (HOSPITAL_BASED_OUTPATIENT_CLINIC_OR_DEPARTMENT_OTHER): Payer: Self-pay | Admitting: Pulmonary Disease

## 2022-10-11 ENCOUNTER — Other Ambulatory Visit: Payer: Self-pay | Admitting: *Deleted

## 2022-10-11 ENCOUNTER — Other Ambulatory Visit (HOSPITAL_COMMUNITY): Payer: Self-pay

## 2022-10-11 ENCOUNTER — Ambulatory Visit: Payer: Self-pay | Admitting: Internal Medicine

## 2022-10-11 MED ORDER — SPIRIVA RESPIMAT 2.5 MCG/ACT IN AERS
2.0000 | INHALATION_SPRAY | Freq: Every day | RESPIRATORY_TRACT | 5 refills | Status: DC
  Filled 2022-10-11: qty 4, 30d supply, fill #0

## 2022-10-11 NOTE — Telephone Encounter (Signed)
Rx has been sent to pharmacy. Nothing further needed.

## 2022-10-11 NOTE — Telephone Encounter (Signed)
From: Judithe Modest To: Office of Clayton Bibles, NP Sent: 10/11/2022 11:09 AM EST Subject: Medication Renewal Request  Refills have been requested for the following medications:   Tiotropium Bromide Monohydrate (SPIRIVA RESPIMAT) 2.5 MCG/ACT AERS [Katherine V Cobb]  Preferred pharmacy: [facility] Delivery method: Mail

## 2022-10-12 ENCOUNTER — Other Ambulatory Visit (HOSPITAL_COMMUNITY): Payer: Self-pay

## 2022-10-12 ENCOUNTER — Encounter (HOSPITAL_COMMUNITY): Payer: Self-pay

## 2022-10-17 DIAGNOSIS — Z23 Encounter for immunization: Secondary | ICD-10-CM | POA: Diagnosis not present

## 2022-10-21 ENCOUNTER — Other Ambulatory Visit (HOSPITAL_COMMUNITY): Payer: Self-pay

## 2022-11-10 ENCOUNTER — Telehealth: Payer: Self-pay | Admitting: Internal Medicine

## 2022-11-10 ENCOUNTER — Telehealth (INDEPENDENT_AMBULATORY_CARE_PROVIDER_SITE_OTHER): Payer: BC Managed Care – PPO | Admitting: Internal Medicine

## 2022-11-10 ENCOUNTER — Encounter (HOSPITAL_BASED_OUTPATIENT_CLINIC_OR_DEPARTMENT_OTHER): Payer: Self-pay | Admitting: Pulmonary Disease

## 2022-11-10 ENCOUNTER — Encounter: Payer: Self-pay | Admitting: Internal Medicine

## 2022-11-10 DIAGNOSIS — I77810 Thoracic aortic ectasia: Secondary | ICD-10-CM

## 2022-11-10 DIAGNOSIS — Z87891 Personal history of nicotine dependence: Secondary | ICD-10-CM

## 2022-11-10 DIAGNOSIS — I288 Other diseases of pulmonary vessels: Secondary | ICD-10-CM | POA: Diagnosis not present

## 2022-11-10 DIAGNOSIS — J849 Interstitial pulmonary disease, unspecified: Secondary | ICD-10-CM | POA: Diagnosis not present

## 2022-11-10 NOTE — Progress Notes (Signed)
OV 07/22/2022  Subjective:  Patient ID: Rebecca Huff, female , DOB: 1956/09/11 , age 66 y.o. , MRN: [id number] , AD[address]ESS: [address] [id number] PCP Harlan Stains, MD Patient Care Team: Harlan Stains, MD as PCP - General (Family Medicine)  This Provider for this visit: Treatment Team:  Attending Provider: Brand Males, MD    07/22/2022 -   Chief Complaint  Patient presents with   Consult    Pt had a recent CT performed which is the reason for today's visit. Pt states she has an occasional tickle in her throat and states the only time she has any SOB is when she goes upstairs.     HPI Rebecca Huff 66 y.o. -referred by Dr. [facility]ile[facility].  Patient has been getting annual CT scans low-dose CT scan of the chest for lung cancer screening since 2018.  She reports that the most recent one in May 2023 was reported as abnormal and therefore she had a high-resolution CT chest and has been referred here.  However my personal visualization I believe there was onset of potential ILD versus bibasilar atelectasis in April 2022.  In November 2022 she suffered COVID-19.  She states the symptoms were quite severe and she was coughing for 3 weeks.  She did take antiviral.  She was not hospitalized.  Then after that she started developing Hartness of breath particular for the last 6 to 8 months.  She did have a CT scan of the chest in May 2023 low-dose where in my personal visualization of the ILD changes in the lung base look more prominent.  This was followed by high-resolution CT chest that I personally visualized.  Only supine images.  Is not prone.  She is overweight and has gained 7 pounds in the last few years.  Again shows more prominent ILD changes versus bibasal atelectasis.  Definitely she is got shortness of breath in the last 6-8 months but she feels it is stable and is only mild and it is first-aid case.    Integrated Comprehensive ILD  Questionnaire  Symptoms:   Past Medical History :  -Has osteoarthritis.  Shoulder pain and neck pain -No collagen vascular disease - Did have outpatient COVID in November 2022 - No kidney disease no heart disease no lung disease otherwise.  No blood clots no Lucy   ROS:  -Nonspecific joint pain with arthralgia consistent with osteoarthritis - Has history of snoring for the last few decades  FAMILY HISTORY of LUNG DISEASE:  Denies*  PERSONAL EXPOSURE HISTORY:  Smoke cigarettes 50 1977 in 20 1720 cigarettes/day and quit.  No marijuana use no cocaine use no intravenous drug use.  HOME  EXPOSURE and HOBBY DETAILS :  [place]  Has been living there for 6 years.  Detail organic antigen exposure history in the house is negative.  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 Concern for interstitial lung disease  on recent screening chest CT.   EXAM: CT CHEST WITHOUT CONTRAST   TECHNIQUE: Multidetector CT imaging of the chest was performed following the standard protocol without intravenous contrast. High resolution imaging of the lungs, as well as inspiratory and expiratory imaging, was performed.   RADIATION DOSE REDUCTION: This exam was performed according to the departmental dose-optimization program which includes automated exposure control, adjustment of the mA and/or kV according to patient size and/or use of iterative reconstruction technique.    COMPARISON:  04/04/2022 screening chest CT.   FINDINGS: Cardiovascular: Normal heart size. No significant pericardial effusion/thickening. Atherosclerotic nonaneurysmal thoracic aorta. Normal caliber pulmonary arteries.   Mediastinum/Nodes: Hypodense 1.3 cm right thyroid nodule is unchanged. Not clinically significant; no follow-up imaging recommended (ref: J Am Coll Radiol. 2015 Feb;12(2): 143-50). Unremarkable esophagus. No pathologically enlarged axillary, mediastinal or hilar lymph nodes, noting limited sensitivity for the detection of hilar adenopathy on this noncontrast study.   Lungs/Pleura: No pneumothorax. No pleural effusion. No acute consolidative airspace disease, lung masses or significant pulmonary nodules. Calcified 4 mm granuloma in the basilar right lower lobe is unchanged. Very mild patchy air trapping in both lungs on the expiration sequence without definite evidence of tracheobronchomalacia. Mild-to-moderate patchy confluent subpleural reticulation and ground-glass opacity in both lungs with a strong posterior lower lobe predominance. No significant regions of traction bronchiectasis, architectural distortion or frank honeycombing. Findings have increased since the baseline 05/08/2017 screening chest CT study and appear mildly increased since 02/27/2021 chest CT.   Upper abdomen: Cholelithiasis.   Musculoskeletal: No aggressive appearing focal osseous lesions. Mild thoracic spondylosis.   IMPRESSION: 1. Mild-to-moderate patchy confluent subpleural reticulation and ground-glass opacity in both lungs with a strong posterior lower lobe predominance. No significant traction bronchiectasis or frank honeycombing. Findings have increased since the baseline 2018 screening chest CT study and appear mildly increased since 02/27/2021 chest CT. Very mild patchy air trapping in both lungs, indicative of small airways disease. Findings are considered indeterminate for  interstitial lung disease such as NSIP or UIP versus a combination of hypoventilatory change and nonspecific bland postinfectious/postinflammatory scarring. Follow-up high-resolution chest CT in 12 months with prone imaging may be considered. Findings are indeterminate for UIP per consensus guidelines: Diagnosis of Idiopathic Pulmonary Fibrosis: An Official ATS/ERS/JRS/ALAT Clinical Practice Guideline. [place], Iss 5, [phone], Jul 29 2017. 2. Cholelithiasis. 3. Aortic Atherosclerosis (ICD10-I70.0).     Electronically Signed   By: Ilona Sorrel M.D.   On: 06/09/2022 09:36         09/07/2022: Today - follow up Patient presents today for follow-up to discuss pulmonary function testing and high-resolution CT scan results.  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  Her pulmonary function testing was overall normal with no formal restrictive or obstructive defect.  She reports that she has been stable since we saw her last.  No increased shortness of breath from her baseline.  She really only gets winded when she gets in a hurry and is doing multiple different things at 1 time.  Otherwise, she does not really notice any any negative impact on her breathing.  She walks for exercise on a regular basis.  Denies any significant cough, chest congestion, wheezing.  She did have some findings of enlarged pulmonary artery on her CT scan.  DLCO was normal and pulmonary function testing.  She denies any lower extremity swelling, PND or orthopnea. She tells me that she does have some trouble with sleeping at night.  After she  was told that she had drops in her oxygen, she stopped using her Xanax and gabapentin.  She wakes frequently throughout the night.  She also feels like she does not sleep as soundly.  She has been told in the past that she snores.  Wakes up in the morning feeling poorly rested and has daytime fatigue symptoms.  She also has morning headaches.  She denies any  witnessed apneas, sleep parasomnia/paralysis, or history of narcolepsy or cataplexy.  She is wearing 1 L supplemental O2 at night; does not like wearing it.  Wants to know if she can come off of it.  TEST/EVENTS:  07/28/2022: Negative autoimmune panel 08/03/2022 HRCT chest: Mild pulmonary parenchymal pattern of ILD, categorized as probable UIP.  Enlarged right and left pulmonary arteries, consistent with pulmonary arterial hypertension. 08/08/2022 ONO 23 min spent <88%; average 91.6% and SpO2 low 81% 09/06/2022 PFTs: FVC 75, FEV1 80, ratio 87, TLC 88, DLCOcor 81  OV 09/15/2022  Subjective:  Patient ID: Rebecca Huff, female , DOB: 29-Mar-1956 , age 63 y.o. , MRN: [id number] , AD[address]ESS: [address] [id number] PCP Harlan Stains, MD Patient Care Team: Harlan Stains, MD as PCP - General (Family Medicine)  This Provider for this visit: Treatment Team:  Attending Provider: Brand Males, MD    09/15/2022 -   Chief Complaint  Patient presents with   Follow-up    Discuss PFT from 09/06/22. Snoring improved.   ILD -subtle since 2017/2018 through 2023 with normal pulmonary function testing except for FVC reduction based on obesity.  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   Former smoker   -No description of emphysema on CT chest but on empiric Spiriva.  Arctic valve calcification on CT scan of the chest 2023 with enlarged pulmonary artery  HPI Teriann B Rosasco 66 y.o. -turns for follow-up.  She presents with her husband.  I am meeting him for the first time.  She tells me that she is doing well.  Nurse practitioner gave her empiric Spiriva.  She is not sure if it is helping her.  She thinks it might be helping her.  Symptom score seems somewhat better.  She is willing to stop it.  She is using nighttime oxygen but its  not helping its actually making her tired.  She wants to stop it.   She is here to discuss findings of the CT scan of the chest and work-up so far.  We visualized the CT scans all the way from 2018 through 2023.  In my personal professional opinion I do not think there is progression.  However the radiologist thinks there is progression.  I do agree with the findings probable UIP although it could easily be indeterminate for UIP.  She does have some crackles at the base.  Given age greater than 50 and crackles in definite ILD findings with subpleural reticulation there is some 20% chance of progression over the next few to several years.  If it is probable UIP then per ATS we can call this is IPF and be more confident about progression.  If it is indeterminate then she will require about lung biopsy.  I discussed these possibilities with her but she is somewhat reluctant to have biopsy at this point.  Also she is not too enthusiastic about starting antifibrotic treatment given  the side effect profile and the fact that she is feeling well right now.  Did discuss about the unpredictable nature of ILD's in the future risk of progression although it is reassuring that so far in the last 4 to 5 years even if there is progression her DLCO still normal.  We resolved that we will discuss this in the case conference and then make a decision.  In addition the CT scan of the chest shows aortic valve calcification and enlarged pulmonary arteries she has not had an echocardiogram before.  She has agreed to get this.     CT Chest data - HRCT 08/13/22  Narrative & Impression  CLINICAL DATA:  Interstitial lung disease. Only prone imaging requested. Shortness of breath with exertion.   EXAM: CT CHEST WITHOUT CONTRAST   TECHNIQUE: Multidetector CT imaging of the chest was performed following the standard protocol without intravenous contrast. High resolution imaging of the lungs, as well as inspiratory and  expiratory imaging, was performed.   RADIATION DOSE REDUCTION: This exam was performed according to the departmental dose-optimization program which includes automated exposure control, adjustment of the mA and/or kV according to patient size and/or use of iterative reconstruction technique.   COMPARISON:  06/07/2022, 04/04/2022, 02/27/2021.   FINDINGS: Cardiovascular: Atherosclerotic calcification of the aorta and aortic valve. Enlarged right and left pulmonary arteries. Heart size at the upper limits of normal. No pericardial or pleural effusion. Distal esophagus is unremarkable.   Mediastinum/Nodes: No pathologically enlarged mediastinal or axillary lymph nodes. Hilar regions are difficult to definitively evaluate without IV contrast. Esophagus is grossly unremarkable.   Lungs/Pleura: Mild peripheral and basilar predominant subpleural reticulation, ground-glass and traction bronchiectasis/bronchiolectasis. Findings were present on prior exams but 1:1 comparison is challenging due to prone imaging on today's study. No pleural fluid. Mild nodularity along the ventral and lateral walls of the trachea can be seen with tracheobronchopathia osteochondroplastica. No air trapping.   Upper Abdomen: Visualized portion of the liver is unremarkable. Stones in the gallbladder. Visualized portions of the adrenal glands, left kidney, spleen, pancreas, stomach and bowel are grossly unremarkable.   Musculoskeletal: Degenerative changes in the spine. No worrisome lytic or sclerotic lesions.   IMPRESSION: 1. 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 Findings are categorized as probable UIP per consensus guidelines: Diagnosis of Idiopathic Pulmonary Fibrosis: An Official ATS/ERS/JRS/ALAT Clinical Practice Guideline. [place], Iss 5, [phone], Jul 29 2017. 2. Cholelithiasis. 3.  Aortic atherosclerosis  (ICD10-I70.0). 4. Enlarged right and left pulmonary arteries, indicative of pulmonary arterial hypertension.     Electronically Signed   By: Lorin Picket M.D.   On: 08/15/2022 14:04          OV 11/10/2022  Subjective:  Patient ID: Rebecca Huff, female , DOB: [birth year] , age 24 y.o. , MRN: [id number] , AD[address]ESS: [address] [id number] PCP Harlan Stains, MD Patient Care Team: Harlan Stains, MD as PCP - General (Family Medicine)  This Provider for this visit: Treatment Team:  Attending Provider: Brand Males, MD  ILD -subtle since 2017/2018 through 2023 with normal pulmonary function testing except for FVC reduction based on obesity.  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  -    Former smoker   -No description of emphysema  on CT chest but on empiric Spiriva.  Arctic valve calcification on CT scan of the chest 2023 with enlarged pulmonary artery  11/10/2022 -  ILD workup in [place]   Type of visit: Video Virtual Visit Identification of patient Rebecca Huff with 09-03-1956 and MRN [id number] - 2 person identifier Risks: Risks, benefits, limitations of telephone visit explained. Patient understood and verbalized agreement to proceed Anyone else on call: ust patient Patient location: [place] wher her Gaylene Brooks is in clinic This provider location: [address], [address]; [place]; [place] [address]. [facility]. [place] 66 y.o. -presents for follow-up.  Is a video visit to discuss outcome of her case conference.  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  It is early and mild.  There is also agreement that there was progression.  I shared these findings with her.  I said the clinical pretest  probably to hear his IPF.  I did indicate to her that if it is IPF it means that she is likely to get worse with time over the next few or several years.  With early disease it might even take 7 to 10 years.  On the other hand she could rapidly decline in just a few years.  I did indicate to her that there are 3 options which includes waiting and watching for development of further progression.  I recommended against this.  The other option is to commit to antifibrotic's.  I did indicate the general side effect nature and the properties of antifibrotic's.  The other option is to participate in clinical trial.  The other option is to go ahead with a lung biopsy to confirm the diagnosis.  She is choosing between lung biopsy versus antifibrotic/clinical trial.  Overall the conference felt quite confident about probable UIP and per latest ATS criteria this would just become IPF without having to undergo lung biopsy.  Did indicate to her that not urgent but it is important in the next few months we get the sorted out.  Did indicate to her that she needs to protect herself from respiratory infections.   We discussed echo report whether some amount of RV strain.  There is also enlarged pulmonary artery on the CT scan.  There is aortic root dilatation.  She is never seen a cardiologist.  I told her I would refer her to 1.  SYMPTOM SCALE - ILD 07/22/2022 09/15/2022 spiriva  Current weight    O2 use ra ra  Shortness of Breath 0 -> 5 scale with 5 being worst (score 6 If unable to do)   At rest 0 0  Simple tasks - showers, clothes change, eating, shaving 0 0  Household (dishes, doing bed, laundry) 1 0  Shopping 0 0  Walking level at own pace 0 0  Walking up Stairs 2 1  Total (30-36) Dyspnea Score 3 1      Non-dyspnea symptoms (0-> 5 scale) 07/22/2022 09/15/2022   How bad is your cough? 0 0  How bad is your fatigue 000 0  How bad is nausea 0 0  How bad is vomiting?  00 0  How bad is diarrhea? 0 0  How  bad is anxiety? 0 0  How bad is depression 0 2  Any chronic pain - if so where and how bad 0 0    PFT     Latest Ref  Rng & Units 09/06/2022    9:04 AM  PFT Results  FVC-Pre L 2.82   FVC-Predicted Pre % 75   FVC-Post L 2.87   FVC-Predicted Post % 76   Pre FEV1/FVC % % 82   Post FEV1/FCV % % 87   FEV1-Pre L 2.33   FEV1-Predicted Pre % 80   FEV1-Post L 2.48   DLCO uncorrected ml/min/mmHg 18.86   DLCO UNC% % 81   DLCO corrected ml/min/mmHg 18.86   DLCO COR %Predicted % 81   DLVA Predicted % 107   TLC L 5.06   TLC % Predicted % 88   RV % Predicted % 73        has a past medical history of Anxiety, Arthritis, Cancer ([place]), Depression, and GERD (gastroesophageal reflux disease).   reports that she quit smoking about 6 years ago. Her smoking use included cigarettes. She has a 35.00 pack-year smoking history. She has never used smokeless tobacco.  Past Surgical History:  Procedure Laterality Date   ABDOMINAL HYSTERECTOMY     CESAREAN SECTION     x 2   DILATION AND CURETTAGE OF UTERUS     JOINT REPLACEMENT     MENISCUS REPAIR     left  knee   TOTAL KNEE ARTHROPLASTY Left 10/29/2013   Dr [name]   TOTAL KNEE ARTHROPLASTY Left 10/29/2013   Procedure: LEFT TOTAL KNEE ARTHROPLASTY;  Surgeon: Garald Balding, MD;  Location: [facility];  Service: Orthopedics;  Laterality: Left;   TOTAL KNEE ARTHROPLASTY Right 04/21/2015   Procedure: TOTAL KNEE ARTHROPLASTY;  Surgeon: Garald Balding, MD;  Location: [facility];  Service: Orthopedics;  Laterality: Right;   TUBAL LIGATION      No Known Allergies  Immunization History  Administered Date(s) Administered   Influenza,inj,Quad PF,6+ Mos 07/29/2021    Family History  Problem Relation Age of Onset   Breast cancer Sister 49     Current Outpatient Medications:    aspirin EC 81 MG tablet, Take 81 mg by mouth daily. Swallow whole., Disp: , Rfl:    atorvastatin (LIPITOR) 20 MG tablet, SMARTSIG:1 Tablet(s) By Mouth Every Evening, Disp:  , Rfl:    Coenzyme Q10 (COQ-10) 400 MG CAPS, Take 1 capsule by mouth daily., Disp: , Rfl:    cyanocobalamin 100 MCG tablet, Take 100 mcg by mouth daily., Disp: , Rfl:    diclofenac (VOLTAREN) 75 MG EC tablet, Take 75 mg by mouth 2 (two) times daily., Disp: , Rfl:    gabapentin (NEURONTIN) 300 MG capsule, Take 300 mg by mouth daily. (Patient not taking: Reported on 09/07/2022), Disp: , Rfl:    LORazepam (ATIVAN) 0.5 MG tablet, Take 0.5 mg by mouth at bedtime., Disp: , Rfl:    Magnesium 400 MG CAPS, Take by mouth., Disp: , Rfl:    Multiple Vitamins-Minerals (MULTIVITAMIN WITH MINERALS) tablet, Take 1 tablet by mouth daily., Disp: , Rfl:    pyridOXINE (VITAMIN B6) 100 MG tablet, Take 100 mg by mouth daily., Disp: , Rfl:    sertraline (ZOLOFT) 50 MG tablet, Take 75 mg by mouth daily., Disp: , Rfl:    Tiotropium Bromide Monohydrate (SPIRIVA RESPIMAT) 2.5 MCG/ACT AERS, Inhale 2 puffs into the lungs daily., Disp: 4 g, Rfl: 5      Objective:   There were no vitals filed for this visit.  Estimated body mass index is 31.2 kg/m as calculated from the following:   Height as of 09/15/22: 5' 8.5" (1.74 m).   Weight as of  09/15/22: 208 lb 3.2 oz (94.4 kg).  '@WEIGHTCHANGE'$ @  There were no vitals filed for this visit.   Physical Exam    General: No distress.  Looks well.  No distress on this video visit Neuro: Alert and Oriented x 3. GCS 15. Speech normal Psych: Pleasant        Assessment:       ICD-10-CM   1. ILD (interstitial lung disease) (HCC)  J84.9 Pulmonary function test    2. Stopped smoking with greater than 30 pack year history  Z87.891     3. 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 Aortic root dilatation (HCC)  I77.810 Ambulatory referral to Cardiology         Plan:     Patient Instructions  ILD (interstitial lung disease) ([place]) Stopped smoking with greater than 30 pack year history  - early dsisease - mild disease  - likely  IPF based on case conference , progression , Prob UIP   Plan = 0 share deciision making  - do spirometry and dlco in Jan/feb 2024 - at followup discuss - antifibrotic/cllinical trial v biopsy (likely the former) - do best to avoid respiratory tract infections  Enlarged pulmonary artery (HCC) Aortic root dilatation [facility])  Plan  - refer Dr Einar Gip or Dr Virgina Jock [facility]   Follow-up - JAn/feb 2024 30 min visit  after PFT  (Level 04: Estb 30-39 min visit type: video virtual visit visit spent in total care time and counseling or/and coordination of care by this undersigned MD - Dr Brand Males. This includes one or more of the following on this same day 11/10/2022: pre-charting, chart review, note writing, documentation discussion of test results, diagnostic or treatment recommendations, prognosis, risks and benefits of management options, instructions, education, compliance or risk-factor reduction. It excludes time spent by the [place] or office staff in the care of the patient . Actual time is 30 min)    SIGNATURE    Dr. Brand Males, M.D., F.C.C.P,  Pulmonary and Critical Care Medicine Staff Physician, [place] Director - Interstitial Lung Disease  Program  Pulmonary [place] [facility] [place], [place], [address]  Pager: [phone], If no answer or between  15:00h - 7:00h: call [phone] Telephone: [phone]  1:58 PM 11/10/2022

## 2022-11-10 NOTE — Progress Notes (Signed)
Interstitial Lung Disease Multidisciplinary Conference   Rebecca Huff    MRN [id number]    DOB 08/09/1956  Primary Care Physician:White, Caren Griffins, MD  Referring Physician: Dr. Brand Males  Time of Conference: 7.00am- 8.00am Date of conference: 10/11/2022 Location of Conference: -  Virtual  Participating Pulmonary: Dr. Brand Males,  Dr Marshell Garfinkel, Dr. Erskine Emery, Dr. Roselie Awkward, Dr. Freda Jackson Pathology:  Radiology: Dr Yetta Glassman  Others:   Brief History: Former heave smoker, BMI 31, Neg serology . 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 PFT is normal. Barely symptoms.  Also described as progressive Discussion: 1. If this is prob UIP - is this early IPF ? -> if so Rx now? 2. Does she need biopsy? 3. Is it progressive?   PFT    Latest Ref Rng & Units 09/06/2022    9:04 AM  PFT Results  FVC-Pre L 2.82   FVC-Predicted Pre % 75   FVC-Post L 2.87   FVC-Predicted Post % 76   Pre FEV1/FVC % % 82   Post FEV1/FCV % % 87   FEV1-Pre L 2.33   FEV1-Predicted Pre % 80   FEV1-Post L 2.48   DLCO uncorrected ml/min/mmHg 18.86   DLCO UNC% % 81   DLCO corrected ml/min/mmHg 18.86   DLCO COR %Predicted % 81   DLVA Predicted % 107   TLC L 5.06   TLC % Predicted % 88   RV % Predicted % 73       MDD discussion of CT scan    - Date or time period of scan:  HRCT: 08/13/2022 HRCT: 06/07/2022  - Discussion synopsis:  HRCT  = subpleural reticulation not resolved with prone. Subtle areas of traction bronchiectasis. No HC. No airtrapping. Review iof CT afrom Aug 2019 - show there is progression now (definite some).   - What is the final conclusion per 2018 ATS/Fleischner Criteria -  PROB UIP.   - Concordance with official report:  CONCORDANT  Pathology discussion of biopsy: No lung biopsy to discuss     MDD Impression/Recs:  IPF even if asymptomatic. start Rx antifibrotic. No need for bx but could consider BAL   Time  Spent in preparation and discussion:  > 30 min    SIGNATURE   Dr. Brand Males, M.D., F.C.C.P,  Pulmonary and Critical Care Medicine Staff Physician, [facility] Director - Interstitial Lung Disease  Program  P[facility] [place] [facility] [place], [place], [address]  Pager: [phone], If no answer or between  15:00h - 7:00h: call [phone] Telephone: [phone]  10:37 AM 11/10/2022 ...................................................................................................................Marland Kitchen References: Diagnosis of Hypersensitivity Pneumonitis in Adults. An Official ATS/JRS/ALAT Clinical Practice Guideline. Ragu G et al, [place] Aug 1;202(3):e36-e69.       Diagnosis of Idiopathic Pulmonary Fibrosis. An Official ATS/ERS/JRS/ALAT Clinical Practice Guideline. Raghu G et al, [place]. 2018 Sep 1;198(5):e44-e68.   IPF Suspected   Histopath ology Pattern      UIP  Probable UIP  Indeterminate for  UIP  Alternative  diagnosis    UIP  IPF  IPF  IPF  Non-IPF dx   HRCT   Probabe UIP  IPF  IPF  IPF (Likely)**  Non-IPF dx  Pattern  Indeterminate for UIP  IPF  IPF (Likely)**  Indeterminate  for IPF**  Non-IPF dx    Alternative  diagnosis  IPF (Likely)**/ non-IPF dx  Non-IPF dx  Non-IPF dx  Non-IPF dx     Idiopathic pulmonary fibrosis diagnosis based upon HRCT and Biopsy paterns.  ** IPF is the likely diagnosis when any of following features are present:  Moderate-to-severe traction bronchiectasis/bronchiolectasis (defined as mild traction bronchiectasis/bronchiolectasis in four or more lobes including the lingual as a lobe, or moderate to severe traction bronchiectasis in two or more lobes) in a man over age 89 years or in a woman over age 34 years Extensive (>30%) reticulation on HRCT and an age >70 years  Increased neutrophils and/or absence of  lymphocytosis in BAL fluid  Multidisciplinary discussion reaches a confident diagnosis of IPF.   **Indeterminate for IPF  Without an adequate biopsy is unlikely to be IPF  With an adequate biopsy may be reclassified to a more specific diagnosis after multidisciplinary discussion and/or additional consultation.   dx = diagnosis; HRCT = high-resolution computed tomography; IPF = idiopathic pulmonary fibrosis; UIP = usual interstitial pneumonia.

## 2022-11-10 NOTE — Telephone Encounter (Signed)
Pt had a video visit with MR today 11/10/22. Plan for follow up is for pt to follow up in either Jan/Feb 2024 having a 26mn PFT prior.   Attempted to call pt to get her scheduled for follow up appts but unable to reach. Left message for her to return call to schedule appts.   Pt will need 343m OV with 3061mPFT (spiro and dlco) either in January or February 2024.   Routing to front desk pool for help with getting appts scheduled.

## 2022-11-10 NOTE — Patient Instructions (Addendum)
ILD (interstitial lung disease) ([place]) Stopped smoking with greater than 30 pack year history  - early dsisease - mild disease  - likely IPF based on case conference , progression , Prob UIP   Plan = 0 share deciision making  - do spirometry and dlco in Jan/feb 2024 - at followup discuss - antifibrotic/cllinical trial v biopsy (likely the former) - do best to avoid respiratory tract infections  Enlarged pulmonary artery (HCC) Aortic root dilatation [facility])  Plan  - refer Dr Einar Gip or Dr Virgina Jock Dr [facility]   Follow-up - JAn/feb 2024 30 min visit  after PFT

## 2022-11-15 ENCOUNTER — Ambulatory Visit (HOSPITAL_BASED_OUTPATIENT_CLINIC_OR_DEPARTMENT_OTHER): Payer: BC Managed Care – PPO | Attending: Nurse Practitioner | Admitting: Pulmonary Disease

## 2022-11-15 VITALS — Ht 68.5 in | Wt 205.0 lb

## 2022-11-15 DIAGNOSIS — R0683 Snoring: Secondary | ICD-10-CM | POA: Insufficient documentation

## 2022-11-17 NOTE — Telephone Encounter (Signed)
Appts have been scheduled.  

## 2022-11-24 ENCOUNTER — Telehealth: Payer: Self-pay | Admitting: Pulmonary Disease

## 2022-11-24 NOTE — Telephone Encounter (Signed)
Called patient, left message to call back to give information per Dr. Ander Slade regarding sleep study results.

## 2022-11-24 NOTE — Telephone Encounter (Signed)
Call patient  Sleep study result  Date of study: 11/15/2022  Impression: Negative for significant sleep disordered breathing Primary snoring Poor sleep efficiency  Recommendation:  Optimize sleep hygiene, getting 6 to 8 hours of sleep is optimal Weight management Avoid alcohol, sedatives and other CNS depressants that may worsen sleep quality  Follow-up as previously scheduled

## 2022-11-24 NOTE — Telephone Encounter (Signed)
Patient returned call, provided results/recommendations per Dr. Ander Slade.  She verbalized understanding.  Advised she has an [place] appointment in our office on 12/19/21 for a breathing test and f/u with Dr. Chase Caller.  Nothing further needed.

## 2022-11-24 NOTE — Procedures (Signed)
POLYSOMNOGRAPHY  Last, FirstShina, Huff MRN: [id number] Gender: Female Age (years): 85 Weight (lbs): 205 DOB: Oct 28, 1956 BMI: 31 Primary Care: No PCP Epworth Score: <br> Referring: Clayton Bibles NP Technician: Rebecca Huff Interpreting: Laurin Coder MD Study Type: NPSG Ordered Study Type: Split Night CPAP Study date: 11/15/2022 Location: [place] CLINICAL INFORMATION Rebecca Huff is a 66 year old Female and was referred to the sleep center for evaluation of G47.30 Sleep Apnea, Unspecified (780.57). Indications include Snoring.  MEDICATIONS Patient self administered medications include: LORAZEPAM. Medications administered during study include Sleep medicine administered - Lorazepam 0.5 mg at 10:16:23 PM  SLEEP STUDY TECHNIQUE A multi-channel overnight Polysomnography study was performed. The channels recorded and monitored were central and occipital EEG, electrooculogram (EOG), submentalis EMG (chin), nasal and oral airflow, thoracic and abdominal wall motion, anterior tibialis EMG, snore microphone, electrocardiogram, and a pulse oximetry. TECHNICIAN COMMENTS Comments added by Technician: No restroom visited. The patient had difficulty initiating sleep. The patient was restless all through the night. Comments added by Scorer: N/A SLEEP ARCHITECTURE The study was initiated at 10:28:00 PM and terminated at 4:26:03 AM. The total recorded time was 358 minutes. EEG confirmed total sleep time was 238 minutes yielding a sleep efficiency of 66.5%. Sleep onset after lights out was 108.0 minutes with a REM latency of 185.0 minutes. The patient spent 2.9% of the night in stage N1 sleep, 92.9% in stage N2 sleep, 3.8% in stage N3 and 0.4% in REM. Wake after sleep onset (WASO) was 12.0 minutes. The Arousal Index was 16.9/hour. RESPIRATORY PARAMETERS There were a total of 0 respiratory disturbances out of which 0 were apneas ( 0 obstructive, 0 mixed, 0 central) and 0 hypopneas. The  apnea/hypopnea index (AHI) was 0.0 events/hour. The central sleep apnea index was 0 events/hour. The REM AHI was 0.0 events/hour and NREM AHI was 0.0 events/hour. The supine AHI was 0.0 events/hour and the non supine AHI was 0 supine during 64.08% of sleep. Respiratory disturbances were associated with oxygen desaturation down to a nadir of 89.0% during sleep. The mean oxygen saturation during the study was 91.7%.   LEG MOVEMENT DATA The total leg movements were 221 with a resulting leg movement index of 55.7/hr .Associated arousal with leg movement index was 4.3/hr.  CARDIAC DATA The underlying cardiac rhythm was most consistent with sinus rhythm. Mean heart rate during sleep was 77.8 bpm. Additional rhythm abnormalities include None.  IMPRESSIONS - No Significant Obstructive Sleep apnea(OSA) - EKG showed no cardiac abnormalities. - Mild Oxygen Desaturation - The patient snored with a moderate snoring volume. - No significant periodic leg movements(PLMs) during sleep. However, no significant associated arousals.  DIAGNOSIS - Poor sleep efficiency - Negative for significant sleep disordered breathing - Snoring  RECOMMENDATIONS - Avoid alcohol, sedatives and other CNS depressants that may worsen sleep apnea and disrupt normal sleep architecture. - Sleep hygiene should be reviewed to assess factors that may improve sleep quality. - Weight management and regular exercise should be initiated or continued.  [Electronically signed] 11/24/2022 05:52 AM  Sherrilyn Rist MD NPI: [id number]

## 2022-11-25 DIAGNOSIS — M1811 Unilateral primary osteoarthritis of first carpometacarpal joint, right hand: Secondary | ICD-10-CM | POA: Diagnosis not present

## 2022-11-25 DIAGNOSIS — G5601 Carpal tunnel syndrome, right upper limb: Secondary | ICD-10-CM | POA: Diagnosis not present

## 2022-11-30 ENCOUNTER — Encounter: Payer: Self-pay | Admitting: Cardiology

## 2022-11-30 ENCOUNTER — Ambulatory Visit: Payer: BC Managed Care – PPO | Admitting: Cardiology

## 2022-11-30 VITALS — BP 116/74 | HR 102 | Resp 16 | Ht 68.5 in | Wt 204.8 lb

## 2022-11-30 DIAGNOSIS — E7800 Pure hypercholesterolemia, unspecified: Secondary | ICD-10-CM | POA: Diagnosis not present

## 2022-11-30 DIAGNOSIS — J84112 Idiopathic pulmonary fibrosis: Secondary | ICD-10-CM | POA: Diagnosis not present

## 2022-11-30 DIAGNOSIS — I7121 Aneurysm of the ascending aorta, without rupture: Secondary | ICD-10-CM

## 2022-11-30 DIAGNOSIS — R0609 Other forms of dyspnea: Secondary | ICD-10-CM | POA: Diagnosis not present

## 2022-11-30 DIAGNOSIS — I288 Other diseases of pulmonary vessels: Secondary | ICD-10-CM

## 2022-11-30 DIAGNOSIS — I700 Atherosclerosis of aorta: Secondary | ICD-10-CM

## 2022-11-30 MED ORDER — ATORVASTATIN CALCIUM 40 MG PO TABS: 40.0000 mg | ORAL_TABLET | Freq: Every day | ORAL | 1 refills | Status: DC

## 2022-11-30 NOTE — Progress Notes (Signed)
Primary Physician/Referring:  Harlan Stains, MD  Patient ID: Rebecca Huff, female    DOB: 1956-05-30, 67 y.o.   MRN: [id number]  Chief Complaint  Patient presents with   Enlarged pulmonary artery   aortic root dilatation   New Patient (Initial Visit)    Referred by Dr. Chase Caller   HPI:    Rebecca Huff  is a 67 y.o. Caucasian female patient with 40+ pack year history of smoking cigarettes, quit in 2017, referred to me for evaluation of dyspnea on exertion and abnormal CT scan of the chest revealing dilated pulmonary artery and coronary calcification and aortic atherosclerosis.  Past medical history significant for hypercholesterolemia.  She has gradual onset of dyspnea on exertion.  Denies PND or orthopnea.  States that her activity is not significantly limited.  No chest pain or palpitations.  Symptoms do not assist urtication.  Past Medical History:  Diagnosis Date   Anxiety    Arthritis    Cancer ([place])    skin cancer on anterior left leg   Depression    GERD (gastroesophageal reflux disease)    every now and then and then she takes tums   Past Surgical History:  Procedure Laterality Date   ABDOMINAL HYSTERECTOMY     CESAREAN SECTION     x 2   DILATION AND CURETTAGE OF UTERUS     JOINT REPLACEMENT     MENISCUS REPAIR     left  knee   TOTAL KNEE ARTHROPLASTY Left 10/29/2013   Dr [name]   TOTAL KNEE ARTHROPLASTY Left 10/29/2013   Procedure: LEFT TOTAL KNEE ARTHROPLASTY;  Surgeon: Garald Balding, MD;  Location: [facility];  Service: Orthopedics;  Laterality: Left;   TOTAL KNEE ARTHROPLASTY Right 04/21/2015   Procedure: TOTAL KNEE ARTHROPLASTY;  Surgeon: Garald Balding, MD;  Location: [facility];  Service: Orthopedics;  Laterality: Right;   TUBAL LIGATION     Family History  Problem Relation Age of Onset   Hypertension Mother    Heart failure Mother    Heart disease Mother    Other Father        Farming accident   Breast cancer Sister 62   Diabetes Paternal  Grandmother    Diabetes Paternal Grandfather     Social History   Tobacco Use   Smoking status: Former    Packs/day: 1.00    Years: 35.00    Total pack years: 35.00    Types: Cigarettes    Quit date: 2017    Years since quitting: 7.0   Smokeless tobacco: Never  Substance Use Topics   Alcohol use: Yes    Alcohol/week: 4.0 standard drinks of alcohol    Types: 4 Glasses of wine per week    Comment: a couple glasses a week   Marital Status: Married  ROS  Review of Systems  Cardiovascular:  Positive for dyspnea on exertion. Negative for chest pain and leg swelling.   Objective      11/30/2022    2:37 PM 11/15/2022   11:10 PM 09/15/2022    4:12 PM  Vitals with BMI  Height 5' 8.5" 5' 8.5" 5' 8.5"  Weight 204 lbs 13 oz 205 lbs 208 lbs 3 oz  BMI 30.68 46.96 29.52  Systolic 841  324  Diastolic 74  68  Pulse 401  70    Physical Exam Constitutional:      Appearance: She is obese.  Neck:     Vascular: No carotid bruit or JVD.  Cardiovascular:     Rate and Rhythm: Normal rate and regular rhythm.     Pulses: Intact distal pulses.     Heart sounds: Normal heart sounds. No murmur heard.    No gallop.  Pulmonary:     Effort: Pulmonary effort is normal.     Breath sounds: Normal breath sounds.  Abdominal:     General: Bowel sounds are normal.     Palpations: Abdomen is soft.  Musculoskeletal:     Right lower leg: No edema.     Left lower leg: No edema.     Medications and allergies  No Known Allergies   Medication list   Current Outpatient Medications:    aspirin EC 81 MG tablet, Take 81 mg by mouth daily. Swallow whole., Disp: , Rfl:    Coenzyme Q10 (COQ-10) 400 MG CAPS, Take 1 capsule by mouth daily., Disp: , Rfl:    diclofenac (VOLTAREN) 75 MG EC tablet, Take 75 mg by mouth 2 (two) times daily., Disp: , Rfl:    LORazepam (ATIVAN) 0.5 MG tablet, Take 0.5 mg by mouth at bedtime., Disp: , Rfl:    Magnesium 400 MG CAPS, Take by mouth., Disp: , Rfl:    Multiple  Vitamins-Minerals (MULTIVITAMIN WITH MINERALS) tablet, Take 1 tablet by mouth daily., Disp: , Rfl:    sertraline (ZOLOFT) 50 MG tablet, Take 75 mg by mouth daily., Disp: , Rfl:    atorvastatin (LIPITOR) 40 MG tablet, Take 1 tablet (40 mg total) by mouth daily., Disp: 90 tablet, Rfl: 1 Laboratory examination:   External labs:   Cholesterol, total 174.000 m 02/16/2022 HDL 58.000 mg 02/16/2022 LDL 89.000 mg 02/16/2022 Triglycerides 155.000 m 02/16/2022  A1C 5.800 % 02/16/2022 TSH 0.640 02/16/2022  Hemoglobin 14.500 g/d 02/16/2022  Creatinine, Serum 0.750 mg/ 02/16/2022 Potassium 4.500 mm 02/16/2022 Magnesium N/D ALT (SGPT) 28.000 U/L 02/16/2022   Radiology:   High-resolution CT chest 08/15/2022: 1. 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 Findings are categorized as probable UIP per consensus guidelines: Diagnosis of Idiopathic Pulmonary Fibrosis: An Official ATS/ERS/JRS/ALAT Clinical Practice Guideline. [place], Iss 5, [phone], Jul 29 2017. 2. Cholelithiasis. 3.  Aortic atherosclerosis (ICD10-I70.0). 4. Enlarged right and left pulmonary arteries, indicative of pulmonary arterial hypertension.  Cardiac Studies:   Echocardiogram 10/03/2022:   1. Left ventricular ejection fraction, by estimation, is 60 to 65%. The left ventricle has normal function. The left ventricle has no regional wall motion abnormalities. Left ventricular diastolic parameters were normal.  2. Right ventricular systolic function is mildly reduced. The right ventricular size is mildly enlarged.  3. The mitral valve is normal in structure. Mild mitral valve regurgitation.  4. The aortic valve is tricuspid. Aortic valve regurgitation is not visualized.  5. Aortic dilatation noted. There is mild dilatation of the ascending aorta, measuring 40 mm.  6. The inferior vena cava is normal in size with greater than 50% respiratory  variability, suggesting right atrial pressure of 3 mmHg.  EKG:   EKG 11/30/2022: Normal sinus rhythm at rate of 85 bpm, incomplete right bundle branch block.  Low-voltage complexes.  Consider pulmonary disease pattern.    Assessment     ICD-10-CM   1. Dyspnea on exertion  R06.09 PCV MYOCARDIAL PERFUSION WO LEXISCAN    Pro b natriuretic peptide (BNP)    2. 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 EKG 12-Lead    3. IPF (idiopathic pulmonary fibrosis) ([place])  F75.102  4. Aortic atherosclerosis (HCC)  I70.0 PCV MYOCARDIAL PERFUSION WO LEXISCAN    Lipid Panel With LDL/HDL Ratio    Apo A1 + B + Ratio    High sensitivity CRP    Lipoprotein A (LPA)    5. Pure hypercholesterolemia  E78.00 atorvastatin (LIPITOR) 40 MG tablet    Lipid Panel With LDL/HDL Ratio    Apo A1 + B + Ratio    High sensitivity CRP    Lipoprotein A (LPA)    6. Aneurysm of ascending aorta without rupture (HCC)  I71.21        Orders Placed This Encounter  Procedures   Lipid Panel With LDL/HDL Ratio   Apo A1 + B + Ratio   High sensitivity CRP   Lipoprotein A (LPA)   Pro b natriuretic peptide (BNP)   PCV MYOCARDIAL PERFUSION WO LEXISCAN    Standing Status:   Future    Standing Expiration Date:   01/29/2023   EKG 12-Lead    Meds ordered this encounter  Medications   atorvastatin (LIPITOR) 40 MG tablet    Sig: Take 1 tablet (40 mg total) by mouth daily.    Dispense:  90 tablet    Refill:  1    Medications Discontinued During This Encounter  Medication Reason   cyanocobalamin 100 MCG tablet    gabapentin (NEURONTIN) 300 MG capsule    pyridOXINE (VITAMIN B6) 100 MG tablet    Tiotropium Bromide Monohydrate (SPIRIVA RESPIMAT) 2.5 MCG/ACT AERS    atorvastatin (LIPITOR) 20 MG tablet Reorder     Recommendations:   Rebecca Huff is a 67 y.o. Caucasian female patient with 40+ pack year history of smoking cigarettes, quit in 2017, referred to me for evaluation of dyspnea on exertion and abnormal CT scan of the  chest revealing dilated pulmonary artery and coronary calcification and aortic atherosclerosis.  Past medical history significant for hypercholesterolemia.  1. Dyspnea on exertion Patient's gradual onset of dyspnea on exertion is related to underlying IPF but underlying significant coronary disease cannot be excluded.  I reviewed the echocardiogram, she also has evidence of right ventricular strain and features suggestive of chronic cor pulmonale.  This appears to be out of proportion to IPF, PFTs revealed mild abnormalities only.  Evaluation for pulm hypertension is indicated.  2. Enlarged pulmonary artery ([place]) This is again suggestive of pulm hypertension.  I will complete the evaluation once we have all the data available.  3. IPF (idiopathic pulmonary fibrosis) (HCC) Suspect IPF is probably related to prior history of tobacco use disorder and smoking and has 40+ pack year history of smoking cigarettes.  4. Aortic atherosclerosis (HCC) Surprisingly she has significant amount of aortic atherosclerosis by CT scan that was done previously along with coronary calcification as well.  Will be aggressive with lipid management.  5. Pure hypercholesterolemia I have increased the dose of atorvastatin from 20 mg to 40 mg.  Will obtain advanced lipids.  6. Aneurysm of ascending aorta without rupture ([place]) She has a 4 cm ascending aortic aneurysm, this was noted sometime by CT scan in 2020, recent CT scans did not mention this however echocardiogram also notes that she has ascending aortic dilatation at 4 cm.  I will consider adding losartan however her blood pressure is soft.  Overall I would like to evaluate her symptoms and obtain nuclear stress test and then decide whether she needs left and right heart catheterization or just right heart catheterization only.     Adrian Prows, MD,  [facility] 11/30/2022, 5:58 PM Office: [phone]

## 2022-12-05 DIAGNOSIS — Z4789 Encounter for other orthopedic aftercare: Secondary | ICD-10-CM | POA: Diagnosis not present

## 2022-12-05 DIAGNOSIS — G5601 Carpal tunnel syndrome, right upper limb: Secondary | ICD-10-CM | POA: Diagnosis not present

## 2022-12-13 ENCOUNTER — Other Ambulatory Visit: Payer: BC Managed Care – PPO

## 2022-12-19 ENCOUNTER — Ambulatory Visit: Payer: BC Managed Care – PPO | Admitting: Internal Medicine

## 2022-12-20 ENCOUNTER — Other Ambulatory Visit: Payer: BC Managed Care – PPO

## 2022-12-26 ENCOUNTER — Ambulatory Visit: Payer: BC Managed Care – PPO

## 2022-12-26 DIAGNOSIS — I700 Atherosclerosis of aorta: Secondary | ICD-10-CM | POA: Diagnosis not present

## 2022-12-26 DIAGNOSIS — R0609 Other forms of dyspnea: Secondary | ICD-10-CM | POA: Diagnosis not present

## 2022-12-27 ENCOUNTER — Ambulatory Visit: Payer: BC Managed Care – PPO | Admitting: Internal Medicine

## 2023-01-02 DIAGNOSIS — I700 Atherosclerosis of aorta: Secondary | ICD-10-CM | POA: Diagnosis not present

## 2023-01-02 DIAGNOSIS — E7800 Pure hypercholesterolemia, unspecified: Secondary | ICD-10-CM | POA: Diagnosis not present

## 2023-01-04 LAB — LIPID PANEL WITH LDL/HDL RATIO
Cholesterol, Total: 141 mg/dL (ref 100–199)
HDL: 50 mg/dL (ref >39)
LDL Chol Calc (NIH): 66 mg/dL (ref 0–99)
LDL/HDL Ratio: 1.3 ratio (ref 0.0–3.2)
Triglycerides: 148 mg/dL (ref 0–149)
VLDL Cholesterol Cal: 25 mg/dL (ref 5–40)

## 2023-01-04 LAB — APO A1 + B + RATIO
Apolipo. B/A-1 Ratio: 0.4 ratio (ref 0.0–0.6)
Apolipoprotein A-1: 157 mg/dL (ref 116–209)
Apolipoprotein B: 62 mg/dL (ref <90)

## 2023-01-04 LAB — HIGH SENSITIVITY CRP: CRP, High Sensitivity: 0.43 mg/L (ref 0.00–3.00)

## 2023-01-04 LAB — LIPOPROTEIN A (LPA): Lipoprotein (a): 36.9 nmol/L (ref <75.0)

## 2023-01-04 NOTE — Progress Notes (Signed)
Labs 01/02/2023:  CRP, High Sensitivity                        0.00 - 3.00 mg/L                     0.43  Apolipoprotein A-1                            116 - 209 mg/dL                      157 Apolipoprotein B                              <90 dL                                       62 Lipoprotein (a)                                  <75.0 nmol/L                            36.9  Lipid panel is normal.

## 2023-01-09 ENCOUNTER — Ambulatory Visit (INDEPENDENT_AMBULATORY_CARE_PROVIDER_SITE_OTHER): Payer: BC Managed Care – PPO | Admitting: Internal Medicine

## 2023-01-09 DIAGNOSIS — J849 Interstitial pulmonary disease, unspecified: Secondary | ICD-10-CM | POA: Diagnosis not present

## 2023-01-09 LAB — PULMONARY FUNCTION TEST
DL/VA % pred: 104 %
DL/VA: 4.24 ml/min/mmHg/L
DLCO cor % pred: 79 %
DLCO cor: 17.67 ml/min/mmHg
DLCO unc % pred: 79 %
DLCO unc: 17.67 ml/min/mmHg
FEF 25-75 Pre: 2.57 L/sec
FEF2575-%Pred-Pre: 107 %
FEV1-%Pred-Pre: 85 %
FEV1-Pre: 2.38 L
FEV1FVC-%Pred-Pre: 106 %
FEV6-%Pred-Pre: 82 %
FEV6-Pre: 2.89 L
FEV6FVC-%Pred-Pre: 103 %
FVC-%Pred-Pre: 79 %
FVC-Pre: 2.9 L
Pre FEV1/FVC ratio: 82 %
Pre FEV6/FVC Ratio: 100 %

## 2023-01-09 NOTE — Progress Notes (Signed)
Spirometry and DLCO completed today  

## 2023-01-10 ENCOUNTER — Encounter: Payer: Self-pay | Admitting: Cardiology

## 2023-01-10 ENCOUNTER — Ambulatory Visit: Payer: BC Managed Care – PPO | Admitting: Cardiology

## 2023-01-10 ENCOUNTER — Encounter: Payer: Self-pay | Admitting: Internal Medicine

## 2023-01-10 ENCOUNTER — Ambulatory Visit (INDEPENDENT_AMBULATORY_CARE_PROVIDER_SITE_OTHER): Payer: BC Managed Care – PPO | Admitting: Internal Medicine

## 2023-01-10 VITALS — BP 112/73 | HR 84 | Resp 16 | Ht 68.5 in | Wt 205.4 lb

## 2023-01-10 VITALS — BP 120/78 | HR 81 | Temp 98.0°F | Ht 68.5 in | Wt 204.4 lb

## 2023-01-10 DIAGNOSIS — E7800 Pure hypercholesterolemia, unspecified: Secondary | ICD-10-CM | POA: Diagnosis not present

## 2023-01-10 DIAGNOSIS — J84112 Idiopathic pulmonary fibrosis: Secondary | ICD-10-CM

## 2023-01-10 DIAGNOSIS — I700 Atherosclerosis of aorta: Secondary | ICD-10-CM | POA: Diagnosis not present

## 2023-01-10 DIAGNOSIS — I288 Other diseases of pulmonary vessels: Secondary | ICD-10-CM | POA: Diagnosis not present

## 2023-01-10 DIAGNOSIS — R0609 Other forms of dyspnea: Secondary | ICD-10-CM | POA: Diagnosis not present

## 2023-01-10 MED ORDER — DOXYCYCLINE HYCLATE 100 MG PO TABS: 100.0000 mg | ORAL_TABLET | Freq: Two times a day (BID) | ORAL | 0 refills | Status: DC

## 2023-01-10 MED ORDER — PREDNISONE 10 MG PO TABS: ORAL_TABLET | ORAL | 0 refills | Status: AC

## 2023-01-10 MED ORDER — ATORVASTATIN CALCIUM 40 MG PO TABS: 40.0000 mg | ORAL_TABLET | Freq: Every day | ORAL | 3 refills | Status: DC

## 2023-01-10 NOTE — Progress Notes (Unsigned)
OV 07/22/2022  Subjective:  Patient ID: Rebecca Huff, female , DOB: 1956-11-07 , age 67 y.o. , MRN: [id number] , AD[address]ESS: [address] [id number] PCP Harlan Stains, MD Patient Care Team: Harlan Stains, MD as PCP - General (Family Medicine)  This Provider for this visit: Treatment Team:  Attending Provider: Brand Males, MD    07/22/2022 -   Chief Complaint  Patient presents with   Consult    Pt had a recent CT performed which is the reason for today's visit. Pt states she has an occasional tickle in her throat and states the only time she has any SOB is when she goes upstairs.     HPI Rebecca Huff 67 y.o. -referred by Dr. Aile[facility].  Patient has been getting annual CT scans low-dose CT scan of the chest for lung cancer screening since 2018.  She reports that the most recent one in May 2023 was reported as abnormal and therefore she had a high-resolution CT chest and has been referred here.  However my personal visualization I believe there was onset of potential ILD versus bibasilar atelectasis in April 2022.  In November 2022 she suffered COVID-19.  She states the symptoms were quite severe and she was coughing for 3 weeks.  She did take antiviral.  She was not hospitalized.  Then after that she started developing Hartness of breath particular for the last 6 to 8 months.  She did have a CT scan of the chest in May 2023 low-dose where in my personal visualization of the ILD changes in the lung base look more prominent.  This was followed by high-resolution CT chest that I personally visualized.  Only supine images.  Is not prone.  She is overweight and has gained 7 pounds in the last few years.  Again shows more prominent ILD changes versus bibasal atelectasis.  Definitely she is got shortness of breath in the last 6-8 months but she feels it is stable and is only mild and it is first-aid case.   [facility] Comprehensive  ILD Questionnaire  Symptoms:   Past Medical History :  -Has osteoarthritis.  Shoulder pain and neck pain -No collagen vascular disease - Did have outpatient COVID in November 2022 - No kidney disease no heart disease no lung disease otherwise.  No blood clots no Lucy   ROS:  -Nonspecific joint pain with arthralgia consistent with osteoarthritis - Has history of snoring for the last few decades  FAMILY HISTORY of LUNG DISEASE:  Denies*  PERSONAL EXPOSURE HISTORY:  Smoke cigarettes 50 1977 in 20 1720 cigarettes/day and quit.  No marijuana use no cocaine use no intravenous drug use.  HOME  EXPOSURE and HOBBY DETAILS :  [place]  Has been living there for 6 years.  Detail organic antigen exposure history in the house is negative.  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 Concern for interstitial lung  disease on recent screening chest CT.   EXAM: CT CHEST WITHOUT CONTRAST   TECHNIQUE: Multidetector CT imaging of the chest was performed following the standard protocol without intravenous contrast. High resolution imaging of the lungs, as well as inspiratory and expiratory imaging, was performed.   RADIATION DOSE REDUCTION: This exam was performed according to the departmental dose-optimization program which includes automated exposure control, adjustment of the mA and/or kV according to patient size and/or use of iterative reconstruction  technique.   COMPARISON:  04/04/2022 screening chest CT.   FINDINGS: Cardiovascular: Normal heart size. No significant pericardial effusion/thickening. Atherosclerotic nonaneurysmal thoracic aorta. Normal caliber pulmonary arteries.   Mediastinum/Nodes: Hypodense 1.3 cm right thyroid nodule is unchanged. Not clinically significant; no follow-up imaging recommended (ref: J Am Coll Radiol. 2015 Feb;12(2): 143-50). Unremarkable esophagus. No pathologically enlarged axillary, mediastinal or hilar lymph nodes, noting limited sensitivity for the detection of hilar adenopathy on this noncontrast study.   Lungs/Pleura: No pneumothorax. No pleural effusion. No acute consolidative airspace disease, lung masses or significant pulmonary nodules. Calcified 4 mm granuloma in the basilar right lower lobe is unchanged. Very mild patchy air trapping in both lungs on the expiration sequence without definite evidence of tracheobronchomalacia. Mild-to-moderate patchy confluent subpleural reticulation and ground-glass opacity in both lungs with a strong posterior lower lobe predominance. No significant regions of traction bronchiectasis, architectural distortion or frank honeycombing. Findings have increased since the baseline 05/08/2017 screening chest CT study and appear mildly increased since 02/27/2021 chest CT.   Upper abdomen: Cholelithiasis.   Musculoskeletal: No aggressive appearing focal osseous lesions. Mild thoracic spondylosis.   IMPRESSION: 1. Mild-to-moderate patchy confluent subpleural reticulation and ground-glass opacity in both lungs with a strong posterior lower lobe predominance. No significant traction bronchiectasis or frank honeycombing. Findings have increased since the baseline 2018 screening chest CT study and appear mildly increased since 02/27/2021 chest CT. Very mild patchy air trapping in both lungs, indicative of small airways disease. Findings are  considered indeterminate for interstitial lung disease such as NSIP or UIP versus a combination of hypoventilatory change and nonspecific bland postinfectious/postinflammatory scarring. Follow-up high-resolution chest CT in 12 months with prone imaging may be considered. Findings are indeterminate for UIP per consensus guidelines: Diagnosis of Idiopathic Pulmonary Fibrosis: An Official ATS/ERS/JRS/ALAT Clinical Practice Guideline. [place], Iss 5, [phone], Jul 29 2017. 2. Cholelithiasis. 3. Aortic Atherosclerosis (ICD10-I70.0).     Electronically Signed   By: Ilona Sorrel M.D.   On: 06/09/2022 09:36         09/07/2022: Today - follow up Patient presents today for follow-up to discuss pulmonary function testing and high-resolution CT scan results.  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  Her pulmonary function testing was overall normal with no formal restrictive or obstructive defect.  She reports that she has been stable since we saw her last.  No increased shortness of breath from her baseline.  She really only gets winded when she gets in a hurry and is doing multiple different things at 1 time.  Otherwise, she does not really notice any any negative impact on her breathing.  She walks for exercise on a regular basis.  Denies any significant cough, chest congestion, wheezing.  She did have some findings of enlarged pulmonary artery on her CT scan.  DLCO was normal and pulmonary function testing.  She denies any lower extremity swelling, PND or orthopnea. She tells me that she does have some trouble with sleeping at night.  After  she was told that she had drops in her oxygen, she stopped using her Xanax and gabapentin.  She wakes frequently throughout the night.  She also feels like she does not sleep as soundly.  She has been told in the past that she snores.  Wakes up in the morning feeling poorly rested and has daytime fatigue symptoms.  She also has morning  headaches.  She denies any witnessed apneas, sleep parasomnia/paralysis, or history of narcolepsy or cataplexy.  She is wearing 1 L supplemental O2 at night; does not like wearing it.  Wants to know if she can come off of it.  TEST/EVENTS:  07/28/2022: Negative autoimmune panel 08/03/2022 HRCT chest: Mild pulmonary parenchymal pattern of ILD, categorized as probable UIP.  Enlarged right and left pulmonary arteries, consistent with pulmonary arterial hypertension. 08/08/2022 ONO 23 min spent <88%; average 91.6% and SpO2 low 81% 09/06/2022 PFTs: FVC 75, FEV1 80, ratio 87, TLC 88, DLCOcor 81  OV 09/15/2022  Subjective:  Patient ID: Rebecca Huff, female , DOB: 1956-10-22 , age 81 y.o. , MRN: [id number] , [address]ESS: [address] [address] PCP Harlan Stains, MD Patient Care Team: Harlan Stains, MD as PCP - General (Family Medicine)  This Provider for this visit: Treatment Team:  Attending Provider: Brand Males, MD    09/15/2022 -   Chief Complaint  Patient presents with   Follow-up    Discuss PFT from 09/06/22. Snoring improved.   ILD -subtle since 2017/2018 through 2023 with normal pulmonary function testing except for FVC reduction based on obesity.  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   Former smoker   -No description of emphysema on CT chest but on empiric Spiriva.  Arctic valve calcification on CT scan of the chest 2023 with enlarged pulmonary artery  HPI Rebecca Huff 67 y.o. -turns for follow-up.  She presents with her husband.  I am meeting him for the first time.  She tells me that she is doing well.  Nurse practitioner gave her empiric Spiriva.  She is not sure if it is helping her.  She thinks it might be helping her.  Symptom score seems somewhat better.  She is willing to stop it.  She is using  nighttime oxygen but its not helping its actually making her tired.  She wants to stop it.   She is here to discuss findings of the CT scan of the chest and work-up so far.  We visualized the CT scans all the way from 2018 through 2023.  In my personal professional opinion I do not think there is progression.  However the radiologist thinks there is progression.  I do agree with the findings probable UIP although it could easily be indeterminate for UIP.  She does have some crackles at the base.  Given age greater than 16 and crackles in definite ILD findings with subpleural reticulation there is some 20% chance of progression over the next few to several years.  If it is probable UIP then per ATS we can call this is IPF and be more confident about progression.  If it is indeterminate then she will require about lung biopsy.  I discussed these possibilities with her but she is somewhat reluctant to have biopsy at this point.  Also she is not too enthusiastic about starting antifibrotic treatment  given the side effect profile and the fact that she is feeling well right now.  Did discuss about the unpredictable nature of ILD's in the future risk of progression although it is reassuring that so far in the last 4 to 5 years even if there is progression her DLCO still normal.  We resolved that we will discuss this in the case conference and then make a decision.  In addition the CT scan of the chest shows aortic valve calcification and enlarged pulmonary arteries she has not had an echocardiogram before.  She has agreed to get this.     CT Chest data - HRCT 08/13/22  Narrative & Impression  CLINICAL DATA:  Interstitial lung disease. Only prone imaging requested. Shortness of breath with exertion.   EXAM: CT CHEST WITHOUT CONTRAST   TECHNIQUE: Multidetector CT imaging of the chest was performed following the standard protocol without intravenous contrast. High resolution imaging of the lungs, as well  as inspiratory and expiratory imaging, was performed.   RADIATION DOSE REDUCTION: This exam was performed according to the departmental dose-optimization program which includes automated exposure control, adjustment of the mA and/or kV according to patient size and/or use of iterative reconstruction technique.   COMPARISON:  06/07/2022, 04/04/2022, 02/27/2021.   FINDINGS: Cardiovascular: Atherosclerotic calcification of the aorta and aortic valve. Enlarged right and left pulmonary arteries. Heart size at the upper limits of normal. No pericardial or pleural effusion. Distal esophagus is unremarkable.   Mediastinum/Nodes: No pathologically enlarged mediastinal or axillary lymph nodes. Hilar regions are difficult to definitively evaluate without IV contrast. Esophagus is grossly unremarkable.   Lungs/Pleura: Mild peripheral and basilar predominant subpleural reticulation, ground-glass and traction bronchiectasis/bronchiolectasis. Findings were present on prior exams but 1:1 comparison is challenging due to prone imaging on today's study. No pleural fluid. Mild nodularity along the ventral and lateral walls of the trachea can be seen with tracheobronchopathia osteochondroplastica. No air trapping.   Upper Abdomen: Visualized portion of the liver is unremarkable. Stones in the gallbladder. Visualized portions of the adrenal glands, left kidney, spleen, pancreas, stomach and bowel are grossly unremarkable.   Musculoskeletal: Degenerative changes in the spine. No worrisome lytic or sclerotic lesions.   IMPRESSION: 1. 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 Findings are categorized as probable UIP per consensus guidelines: Diagnosis of Idiopathic Pulmonary Fibrosis: An Official ATS/ERS/JRS/ALAT Clinical Practice Guideline. [place], Iss 5, [phone], Jul 29 2017. 2. Cholelithiasis. 3.   Aortic atherosclerosis (ICD10-I70.0). 4. Enlarged right and left pulmonary arteries, indicative of pulmonary arterial hypertension.     Electronically Signed   By: Lorin Picket M.D.   On: 08/15/2022 14:04          OV 11/10/2022  Subjective:  Patient ID: Rebecca Huff, female , DOB: 08/29/1956 , age 26 y.o. , MRN: [id number] , [address]ESS: [address] [id number] PCP Harlan Stains, MD Patient Care Team: Harlan Stains, MD as PCP - General (Family Medicine)  This Provider for this visit: Treatment Team:  Attending Provider: Brand Males, MD  11/10/2022 -  ILD workup in [place]   Type of visit: Video Virtual Visit Identification of patient Rebecca Huff with March 08, 1956 and MRN [id number] - 2 person identifier Risks: Risks, benefits, limitations of telephone visit explained. Patient understood and verbalized agreement to proceed Anyone else on call: ust patient Patient location: [place] wher her Gaylene Brooks is in clinic This provider location: [phone]  [address]; [place]; [place] [address]. [facility]. Rudy 67 y.o. -presents for follow-up.  Is a video visit to discuss outcome of her case conference.  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  It is early and mild.  There is also agreement that there was progression.  I shared these findings with her.  I said the clinical pretest probably to hear his IPF.  I did indicate to her that if it is IPF it means that she is likely to get worse with time over the next few or several years.  With early disease it might even take 7 to 10 years.  On the other hand she could rapidly decline in just a few years.  I did indicate to her that there are 3 options which includes waiting and watching for development of further progression.  I recommended against this.  The other option is to commit to antifibrotic's.  I  did indicate the general side effect nature and the properties of antifibrotic's.  The other option is to participate in clinical trial.  The other option is to go ahead with a lung biopsy to confirm the diagnosis.  She is choosing between lung biopsy versus antifibrotic/clinical trial.  Overall the conference felt quite confident about probable UIP and per latest ATS criteria this would just become IPF without having to undergo lung biopsy.  Did indicate to her that not urgent but it is important in the next few months we get the sorted out.  Did indicate to her that she needs to protect herself from respiratory infections.   We discussed echo report whether some amount of RV strain.  There is also enlarged pulmonary artery on the CT scan.  There is aortic root dilatation.  She is never seen a cardiologist.  I told her I would refer her to 1.    OV 01/10/2023  Subjective:  Patient ID: Rebecca Huff, female , DOB: 1956-06-16 , age 9 y.o. , MRN: [id number] , [address]ESS: [address] [address] PCP Harlan Stains, MD Patient Care Team: Harlan Stains, MD as PCP - General (Family Medicine) Brand Males, MD as Consulting Physician (Pulmonary Disease)  This Provider for this visit: Treatment Team:  Attending Provider: Brand Males, MD    01/10/2023 -   Chief Complaint  Patient presents with   Follow-up    Sinus congestion and cough x 1 week.  Did see Dr. Einar Gip. Had stress test.  Review PFT from 11/09/23.    ILD -subtle since 2017/2018 through 2023 with normal pulmonary function testing except for FVC reduction based on obesity.  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  Former smoker   -No description of emphysema on CT chest but on empiric Spiriva.  Arctic valve calcification on CT scan of the chest 2023 with enlarged pulmonary artery  HPI Rebecca Huff 67 y.o. -returns with her husband.  He is an independent historian.  She feels stable.  Symptom score below shows stability.  In the interim she has seen Dr. Einar Gip for her aortic  valve calcification and enlarged pulmonary artery.  In addition review of the records indicate he was diagnosed with a 4 cm ascending aortic aneurysm.  He is contemplating a nuclear stress test and then deciding whether she needs left versus right heart catheterization or just right heart catheterization only.  She has follow-up visit with him pending.  In terms of ILD she continues to be stable.  We again visualized the images.  My personal professional opinion the onset of ILD was somewhere in 2020.  In 2017 and 2018 she might not have had ILD.  And since 2028 tad worse perhaps.  Radiology conference definitely felt it was worse.  Still her pulmonary function test today is still normal and in the exam if she has crackles then that she has very minimal crackles on the right base.  She and I and her husband we discussed options in a care which include continued monitoring versus antifibrotic therapy initiation right now.  We discussed the side effects of antifibrotic therapy though the reversible are unpleasant and can be unpleasant occasionally or periodically.  Some of the data indicate that after a few years more than 50% of patients give up on these medications.  At the same time that the patient is tolerating these medications for many years.  We discussed about getting biopsy and the 2 biopsy methods including bronchoscopy without any genomic classifier.  This is the limitations of this.  Discussed surgical lung biopsy.  Did indicate that the reason for the biopsy would be to diagnose UIP and UIP would be a negative  prognostic sign and rationale for biopsy would be intention to treat early with antifibrotic's.  She did indicate that the presence of these findings do make her anxious.  Her husband prefers a slightly more conservative approach.  We resolved that we would reassess again in a few months with pulmonary function test.  This would give her time to finish her cardiac workup.  Did indicate that my bias would be to at least get a bronchoscopy with lavage and transbronchial biopsies for genomic classifier done.  Along with a MDD this would increase the sensitivity and specificity in diagnosing UIP/IPF.  And the risk would be much less compared to surgical lung biopsy.  She is understanding and accepting of this approach.  Of note for the last week or so she has had sinus complaints.  She thinks it is because of the weather no sick contacts.  There is occasional yellow drainage but mostly clear.  She feels congested.  In the interim no other medical issues.  She feels she might benefit from antibiotic and prednisone course.   SYMPTOM SCALE - ILD 07/22/2022 09/15/2022 spiriva 01/11/2023 Supporitve care  Current weight     O2 use ra ra ra  Shortness of Breath 0 -> 5 scale with 5 being worst (score 6 If unable to do)    At rest 0 0 0  Simple tasks - showers, clothes change, eating, shaving 0 0 0  Household (dishes, doing bed, laundry) 1 0 1  Shopping 0 0 0  Walking level at own pace 0 0 0  Walking up Stairs 2 1 2  $ Total (30-36) Dyspnea Score 3 1 3      $ Non-dyspnea symptoms (0-> 5 scale) 07/22/2022 09/15/2022  01/11/2023   How bad is your cough? 0 0 1  How bad is your fatigue 000 0 2  How bad is nausea 0 0 0  How bad is vomiting?  00 0 0  How bad is diarrhea? 0 0 0  How bad is anxiety? 0 0 1  How bad is depression 0 2 1  Any chronic pain - if so where and how bad 0 0 x     PFT     Latest Ref Rng & Units 01/09/2023   10:17 AM 09/06/2022    9:04 AM  PFT Results  FVC-Pre L 2.90  P 2.82    FVC-Predicted Pre % 79  P 75   FVC-Post L  2.87   FVC-Predicted Post %  76   Pre FEV1/FVC % % 82  P 82   Post FEV1/FCV % %  87   FEV1-Pre L 2.38  P 2.33   FEV1-Predicted Pre % 85  P 80   FEV1-Post L  2.48   DLCO uncorrected ml/min/mmHg 17.67  P 18.86   DLCO UNC% % 79  P 81   DLCO corrected ml/min/mmHg 17.67  P 18.86   DLCO COR %Predicted % 79  P 81   DLVA Predicted % 104  P 107   TLC L  5.06   TLC % Predicted %  88   RV % Predicted %  73     P Preliminary result       has a past medical history of Anxiety, Arthritis, Cancer ([place]), Depression, and GERD (gastroesophageal reflux disease).   reports that she quit smoking about 7 years ago. Her smoking use included cigarettes. She has a 35.00 pack-year smoking history. She has never used smokeless tobacco.  Past Surgical History:  Procedure Laterality Date   ABDOMINAL HYSTERECTOMY     CESAREAN SECTION     x 2   DILATION AND CURETTAGE OF UTERUS     JOINT REPLACEMENT     MENISCUS REPAIR     left  knee   TOTAL KNEE ARTHROPLASTY Left 10/29/2013   Dr [name]   TOTAL KNEE ARTHROPLASTY Left 10/29/2013   Procedure: LEFT TOTAL KNEE ARTHROPLASTY;  Surgeon: Garald Balding, MD;  Location: [facility];  Service: Orthopedics;  Laterality: Left;   TOTAL KNEE ARTHROPLASTY Right 04/21/2015   Procedure: TOTAL KNEE ARTHROPLASTY;  Surgeon: Garald Balding, MD;  Location: [facility];  Service: Orthopedics;  Laterality: Right;   TUBAL LIGATION      No Known Allergies  Immunization History  Administered Date(s) Administered   Influenza,inj,Quad PF,6+ Mos 07/29/2021    Family History  Problem Relation Age of Onset   Hypertension Mother    Heart failure Mother    Heart disease Mother    Other Father        Farming accident   Breast cancer Sister 80   Diabetes Paternal Grandmother    Diabetes Paternal Grandfather      Current Outpatient Medications:    aspirin EC 81 MG tablet, Take 81 mg by mouth daily. Swallow whole., Disp: , Rfl:     atorvastatin (LIPITOR) 40 MG tablet, Take 1 tablet (40 mg total) by mouth daily., Disp: 90 tablet, Rfl: 1   Coenzyme Q10 (COQ-10) 400 MG CAPS, Take 1 capsule by mouth daily., Disp: , Rfl:    dextromethorphan-guaiFENesin (MUCINEX DM) 30-600 MG 12hr tablet, Take 2 tablets by mouth 2 (two) times daily. Taking Mucinex AM/PM twice a day, Disp: , Rfl:    diclofenac (VOLTAREN) 75 MG EC tablet, Take 75 mg by mouth 2 (two) times daily., Disp: , Rfl:    LORazepam (ATIVAN) 0.5 MG tablet, Take 0.5 mg by mouth at bedtime.,  Disp: , Rfl:    Magnesium 400 MG CAPS, Take by mouth., Disp: , Rfl:    Multiple Vitamins-Minerals (MULTIVITAMIN WITH MINERALS) tablet, Take 1 tablet by mouth daily., Disp: , Rfl:    sertraline (ZOLOFT) 50 MG tablet, Take 75 mg by mouth daily., Disp: , Rfl:       Objective:   Vitals:   01/10/23 1002  BP: 120/78  Pulse: 81  Temp: 98 F (36.7 C)  TempSrc: Oral  SpO2: 97%  Weight: 204 lb 6.4 oz (92.7 kg)  Height: 5' 8.5" (1.74 m)    Estimated body mass index is 30.63 kg/m as calculated from the following:   Height as of this encounter: 5' 8.5" (1.74 m).   Weight as of this encounter: 204 lb 6.4 oz (92.7 kg).  @WEIGHTCHANGE$ @  [employer]   01/10/23 1002  Weight: 204 lb 6.4 oz (92.7 kg)     Physical Exam  General: No distress. obese Neuro: Alert and Oriented x 3. GCS 15. Speech normal Psych: Pleasant Resp:  Barrel Chest - no.  Wheeze - no, Crackles - ? Rt base crackles - not sure, No overt respiratory distress CVS: Normal heart sounds. Murmurs - no Ext: Stigmata of Connective Tissue Disease - no HEENT: Normal upper airway. PEERL +. No post nasal drip        Assessment:     No diagnosis found.     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 , progression , Prob UIP   Plan = shared deciision making  - do spirometry and dlco in 2.5  - 3 months - do best to avoid respiratory tract infections - at followup based on PFT  - if progression: start anti-fibrotic (Esbriet or Ofev)   - if stable: options are continued monitoring v biopsy (envisia  v surgical)  Acute sinusitis 01/10/2023  Plan  - Take doxycycline 177m po twice daily x 5 days; take after meals and avoid sunlight - Please take prednisone 40 mg x1 day, then 30 mg x1 day, then 20 mg x1 day, then 10 mg x1 day, and then 5 mg x1 day and stop   Followup  2.5 - 3 months - 30 minute but after PFT    SIGNATURE    Dr. MBrand Males M.D., F.C.C.P,  Pulmonary and Critical Care Medicine Staff Physician, CNorth Light PlantDirector - Interstitial Lung Disease  Program  [place]ulmonary [place] [id number] Pager: [phone] If no answer or between  15:00h - 7:00h: call [phone] Telephone: [phone]  10:48 AM 01/10/2023

## 2023-01-10 NOTE — H&P (View-Only) (Signed)
Primary Physician/Referring:  Harlan Stains, MD  Patient ID: Rebecca Huff, female    DOB: [birth year], 67 y.o.   MRN: [id number]  Chief Complaint  Patient presents with   Shortness of Breath   Hyperlipidemia   HPI:    Rebecca Huff  is a 67 y.o. Caucasian female patient with 40+ pack year history of smoking cigarettes, quit in 2017, referred to me for evaluation of dyspnea on exertion and abnormal CT scan of the chest revealing dilated pulmonary artery and coronary calcification and aortic atherosclerosis.  Past medical history significant for hypercholesterolemia.  She has gradual onset of dyspnea on exertion.  Denies PND or orthopnea.  States that her activity is not significantly limited.  No chest pain or palpitations.  She is accompanied by her husband.  No changes in her symptoms since last office visit 2 months ago.  She underwent nuclear stress test, advanced lipid profile testing.  I had increased her atorvastatin from 20 to 40 mg which she is tolerating. Past Medical History:  Diagnosis Date   Anxiety    Arthritis    Cancer ([place])    skin cancer on anterior left leg   Depression    GERD (gastroesophageal reflux disease)    every now and then and then she takes tums   Past Surgical History:  Procedure Laterality Date   ABDOMINAL HYSTERECTOMY     CESAREAN SECTION     x 2   DILATION AND CURETTAGE OF UTERUS     JOINT REPLACEMENT     MENISCUS REPAIR     left  knee   TOTAL KNEE ARTHROPLASTY Left 10/29/2013   Dr [name]   TOTAL KNEE ARTHROPLASTY Left 10/29/2013   Procedure: LEFT TOTAL KNEE ARTHROPLASTY;  Surgeon: Garald Balding, MD;  Location: [facility];  Service: Orthopedics;  Laterality: Left;   TOTAL KNEE ARTHROPLASTY Right 04/21/2015   Procedure: TOTAL KNEE ARTHROPLASTY;  Surgeon: Garald Balding, MD;  Location: [facility];  Service: Orthopedics;  Laterality: Right;   TUBAL LIGATION     Family History  Problem Relation Age of Onset   Hypertension Mother    Heart  failure Mother    Heart disease Mother    Other Father        Farming accident   Breast cancer Sister 68   Diabetes Paternal Grandmother    Diabetes Paternal Grandfather     Social History   Tobacco Use   Smoking status: Former    Packs/day: 1.00    Years: 35.00    Total pack years: 35.00    Types: Cigarettes    Quit date: 2017    Years since quitting: 7.1   Smokeless tobacco: Never  Substance Use Topics   Alcohol use: Yes    Alcohol/week: 4.0 standard drinks of alcohol    Types: 4 Glasses of wine per week    Comment: a couple glasses a week   Marital Status: Married  ROS  Review of Systems  Cardiovascular:  Positive for dyspnea on exertion. Negative for chest pain and leg swelling.   Objective      01/10/2023    1:54 PM 01/10/2023   10:02 AM 11/30/2022    2:37 PM  Vitals with BMI  Height 5' 8.5" 5' 8.5" 5' 8.5"  Weight 205 lbs 6 oz 204 lbs 6 oz 204 lbs 13 oz  BMI 30.77 123456 [id number]  Systolic [id number] 123456 [id number]  Diastolic 73 78 74  Pulse 84 81 102  Physical Exam Constitutional:      Appearance: She is obese.  Neck:     Vascular: No carotid bruit or JVD.  Cardiovascular:     Rate and Rhythm: Normal rate and regular rhythm.     Pulses: Intact distal pulses.     Heart sounds: Normal heart sounds. No murmur heard.    No gallop.  Pulmonary:     Effort: Pulmonary effort is normal.     Breath sounds: Normal breath sounds.  Abdominal:     General: Bowel sounds are normal.     Palpations: Abdomen is soft.  Musculoskeletal:     Right lower leg: No edema.     Left lower leg: No edema.     Medications and allergies  No Known Allergies   Medication list   Current Outpatient Medications:    aspirin EC 81 MG tablet, Take 81 mg by mouth daily. Swallow whole., Disp: , Rfl:    Coenzyme Q10 (COQ-10) 400 MG CAPS, Take 1 capsule by mouth daily., Disp: , Rfl:    dextromethorphan-guaiFENesin (MUCINEX DM) 30-600 MG 12hr tablet, Take 2 tablets by mouth 2 (two) times daily.  Taking Mucinex AM/PM twice a day, Disp: , Rfl:    diclofenac (VOLTAREN) 75 MG EC tablet, Take 75 mg by mouth 2 (two) times daily., Disp: , Rfl:    doxycycline (VIBRA-TABS) 100 MG tablet, Take 1 tablet (100 mg total) by mouth 2 (two) times daily., Disp: 14 tablet, Rfl: 0   LORazepam (ATIVAN) 0.5 MG tablet, Take 0.5 mg by mouth at bedtime., Disp: , Rfl:    Magnesium 400 MG CAPS, Take by mouth., Disp: , Rfl:    Multiple Vitamins-Minerals (MULTIVITAMIN WITH MINERALS) tablet, Take 1 tablet by mouth daily., Disp: , Rfl:    predniSONE (DELTASONE) 10 MG tablet, Take 4 tablets (40 mg total) by mouth daily with breakfast for 1 day, THEN 3 tablets (30 mg total) daily with breakfast for 1 day, THEN 2 tablets (20 mg total) daily with breakfast for 1 day, THEN 1 tablet (10 mg total) daily with breakfast for 1 day, THEN 0.5 tablets (5 mg total) daily with breakfast for 1 day., Disp: 11 tablet, Rfl: 0   sertraline (ZOLOFT) 50 MG tablet, Take 75 mg by mouth daily., Disp: , Rfl:    atorvastatin (LIPITOR) 40 MG tablet, Take 1 tablet (40 mg total) by mouth daily., Disp: 90 tablet, Rfl: 3 Laboratory examination:   Lab Results  Component Value Date   CHOL 141 01/02/2023   HDL 50 01/02/2023   LDLCALC 66 01/02/2023   TRIG 148 01/02/2023    Labs 01/02/2023:  CRP, High Sensitivity                        0.00 - 3.00 mg/L                     0.43  Apolipoprotein A-1                            116 - 209 mg/dL                      157 Apolipoprotein B                              <90 dL  62 Lipoprotein (a)                                  <75.0 nmol/L                            36.9  External labs:   Cholesterol, total 174.000 m 02/16/2022 HDL 58.000 mg 02/16/2022 LDL 89.000 mg 02/16/2022 Triglycerides 155.000 m 02/16/2022  A1C 5.800 % 02/16/2022 TSH 0.640 02/16/2022  Hemoglobin 14.500 g/d 02/16/2022  Creatinine, Serum 0.750 mg/ 02/16/2022 Potassium 4.500 mm 02/16/2022 Magnesium  N/D ALT (SGPT) 28.000 U/L 02/16/2022   Radiology:   High-resolution CT chest 08/15/2022: 1. 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 Findings are categorized as probable UIP per consensus guidelines: Diagnosis of Idiopathic Pulmonary Fibrosis: An Official ATS/ERS/JRS/ALAT Clinical Practice Guideline. [place], Iss 5, [phone], Jul 29 2017. 2. Cholelithiasis. 3.  Aortic atherosclerosis (ICD10-I70.0). 4. Enlarged right and left pulmonary arteries, indicative of pulmonary arterial hypertension.  Cardiac Studies:   Echocardiogram 10/03/2022:   1. Left ventricular ejection fraction, by estimation, is 60 to 65%. The left ventricle has normal function. The left ventricle has no regional wall motion abnormalities. Left ventricular diastolic parameters were normal.  2. Right ventricular systolic function is mildly reduced. The right ventricular size is mildly enlarged.  3. The mitral valve is normal in structure. Mild mitral valve regurgitation.  4. The aortic valve is tricuspid. Aortic valve regurgitation is not visualized.  5. Aortic dilatation noted. There is mild dilatation of the ascending aorta, measuring 40 mm.  6. The inferior vena cava is normal in size with greater than 50% respiratory variability, suggesting right atrial pressure of 3 mmHg.  PCV MYOCARDIAL PERFUSION WO LEXISCAN 12/26/2022  Narrative Exercise Sestamibi stress test 12/26/2022: Exercise nuclear stress test was performed using Bruce protocol. Patient reached 5.9 METS, and 104% of age predicted maximum heart rate. Exercise capacity was low. No chest pain reported. Heart rate and hemodynamic response were normal. Stress EKG revealed no ischemic changes. Normal myocardial perfusion. Stress LVEF 69% Small LV cavity can limit assessment. Low risk study.    EKG:   EKG 11/30/2022: Normal sinus rhythm at rate of 85 bpm, incomplete right  bundle branch block.  Low-voltage complexes.  Consider pulmonary disease pattern.    Assessment     ICD-10-CM   1. Dyspnea on exertion  R06.09     2. 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 Pure hypercholesterolemia  E78.00 atorvastatin (LIPITOR) 40 MG tablet    4. Aortic atherosclerosis (HCC)  I70.0        No orders of the defined types were placed in this encounter.   Meds ordered this encounter  Medications   atorvastatin (LIPITOR) 40 MG tablet    Sig: Take 1 tablet (40 mg total) by mouth daily.    Dispense:  90 tablet    Refill:  3    Medications Discontinued During This Encounter  Medication Reason   atorvastatin (LIPITOR) 40 MG tablet Reorder      Recommendations:   DAWNI SCHWERY is a 67 y.o. Caucasian female patient with 40+ pack year history of smoking cigarettes, quit in 2017, referred to me for evaluation of dyspnea on exertion and abnormal CT scan of the chest revealing dilated pulmonary artery and coronary calcification  and aortic atherosclerosis.  Past medical history significant for hypercholesterolemia.  1. Dyspnea on exertion Patient's gradual onset of dyspnea on exertion is related to underlying IPF, was seen by Dr. Chase Caller this morning, pulmonary function test have been fairly stable.  I reviewed the results of the nuclear stress test and again the echocardiogram with the patient, low risk nuclear stress test.  In view of this I will schedule her for right heart catheterization to evaluate for pulmonary hypertension.  Unless right heart catheterization is markedly abnormal, I will see her back in 6 months.  She may need repeat echocardiogram at that time. She also has evidence of right ventricular strain and features suggestive of chronic cor pulmonale.  This appears to be out of proportion to IPF, PFTs revealed mild abnormalities only.  Evaluation for pulm hypertension is indicated.  2. Enlarged pulmonary artery ([place]) This is again suggestive of  pulm hypertension.  I will complete the evaluation once we have right heart catheterization data.  If right heart catheterization does not suggest significant pulm hypertension, I will see her back in 6 months otherwise I will see her back sooner.  Suspect IPF is probably related to prior history of tobacco use disorder and smoking and has 40+ pack year history of smoking cigarettes.  She has remained abstinent.  Weight loss and daily exercise discussed with the patient and her husband.  3. Aortic atherosclerosis (HCC) Surprisingly she has significant amount of aortic atherosclerosis by CT scan that was done previously along with coronary calcification as well.  Will be aggressive with lipid management.  She has 4 cm ascending aortic dilatation, she will probably need annual surveillance either by CT scan or by echocardiogram.  Will make decision as we go along.  4. Pure hypercholesterolemia She is tolerating 40 mg of atorvastatin.  I reviewed the results of the lipid profile testing, LDL is now at goal.  Advance lipid profile including LPA are within normal limits.  Continue present medical therapy for now.   Adrian Prows, MD, [facility] 01/10/2023, 2:16 PM Office: [phone]

## 2023-01-10 NOTE — Patient Instructions (Addendum)
ILD (interstitial lung disease) ([place]) Stopped smoking with greater than 30 pack year history  - early dsisease - very mild disease - probable UIP patter on CT - doubt was present in 2018 and 2019 but started in 2020. 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 , progression , Prob UIP   Plan = shared deciision making  - do spirometry and dlco in 2.5  - 3 months - do best to avoid respiratory tract infections - at followup based on PFT  - if progression: start anti-fibrotic (Esbriet or Ofev)   - if stable: options are continued monitoring v biopsy (envisia  v surgical)  Acute sinusitis 01/10/2023  Plan  - Take doxycycline 143m po twice daily x 5 days; take after meals and avoid sunlight - Please take prednisone 40 mg x1 day, then 30 mg x1 day, then 20 mg x1 day, then 10 mg x1 day, and then 5 mg x1 day and stop   Followup  2.5 - 3 months - 30 minute but after PFT

## 2023-01-10 NOTE — Progress Notes (Signed)
Primary Physician/Referring:  Harlan Stains, MD  Patient ID: Rebecca Huff, female    DOB: 08-Jan-1956, 67 y.o.   MRN: [id number]  Chief Complaint  Patient presents with   Shortness of Breath   Hyperlipidemia   HPI:    Rebecca Huff  is a 67 y.o. Caucasian female patient with 40+ pack year history of smoking cigarettes, quit in 2017, referred to me for evaluation of dyspnea on exertion and abnormal CT scan of the chest revealing dilated pulmonary artery and coronary calcification and aortic atherosclerosis.  Past medical history significant for hypercholesterolemia.  She has gradual onset of dyspnea on exertion.  Denies PND or orthopnea.  States that her activity is not significantly limited.  No chest pain or palpitations.  She is accompanied by her husband.  No changes in her symptoms since last office visit 2 months ago.  She underwent nuclear stress test, advanced lipid profile testing.  I had increased her atorvastatin from 20 to 40 mg which she is tolerating. Past Medical History:  Diagnosis Date   Anxiety    Arthritis    Cancer ([place])    skin cancer on anterior left leg   Depression    GERD (gastroesophageal reflux disease)    every now and then and then she takes tums   Past Surgical History:  Procedure Laterality Date   ABDOMINAL HYSTERECTOMY     CESAREAN SECTION     x 2   DILATION AND CURETTAGE OF UTERUS     JOINT REPLACEMENT     MENISCUS REPAIR     left  knee   TOTAL KNEE ARTHROPLASTY Left 10/29/2013   Dr [name]   TOTAL KNEE ARTHROPLASTY Left 10/29/2013   Procedure: LEFT TOTAL KNEE ARTHROPLASTY;  Surgeon: Garald Balding, MD;  Location: [place];  Service: Orthopedics;  Laterality: Left;   TOTAL KNEE ARTHROPLASTY Right 04/21/2015   Procedure: TOTAL KNEE ARTHROPLASTY;  Surgeon: Garald Balding, MD;  Location: [place];  Service: Orthopedics;  Laterality: Right;   TUBAL LIGATION     Family History  Problem Relation Age of Onset   Hypertension Mother    Heart  failure Mother    Heart disease Mother    Other Father        Farming accident   Breast cancer Sister 35   Diabetes Paternal Grandmother    Diabetes Paternal Grandfather     Social History   Tobacco Use   Smoking status: Former    Packs/day: 1.00    Years: 35.00    Total pack years: 35.00    Types: Cigarettes    Quit date: 2017    Years since quitting: 7.1   Smokeless tobacco: Never  Substance Use Topics   Alcohol use: Yes    Alcohol/week: 4.0 standard drinks of alcohol    Types: 4 Glasses of wine per week    Comment: a couple glasses a week   Marital Status: Married  ROS  Review of Systems  Cardiovascular:  Positive for dyspnea on exertion. Negative for chest pain and leg swelling.   Objective      01/10/2023    1:54 PM 01/10/2023   10:02 AM 11/30/2022    2:37 PM  Vitals with BMI  Height 5' 8.5" 5' 8.5" 5' 8.5"  Weight 205 lbs 6 oz 204 lbs 6 oz 204 lbs 13 oz  BMI 30.77 123456 [id number]  Systolic [id number] 123456 [id number]  Diastolic 73 78 74  Pulse 84 81 102  Physical Exam Constitutional:      Appearance: She is obese.  Neck:     Vascular: No carotid bruit or JVD.  Cardiovascular:     Rate and Rhythm: Normal rate and regular rhythm.     Pulses: Intact distal pulses.     Heart sounds: Normal heart sounds. No murmur heard.    No gallop.  Pulmonary:     Effort: Pulmonary effort is normal.     Breath sounds: Normal breath sounds.  Abdominal:     General: Bowel sounds are normal.     Palpations: Abdomen is soft.  Musculoskeletal:     Right lower leg: No edema.     Left lower leg: No edema.     Medications and allergies  No Known Allergies   Medication list   Current Outpatient Medications:    aspirin EC 81 MG tablet, Take 81 mg by mouth daily. Swallow whole., Disp: , Rfl:    Coenzyme Q10 (COQ-10) 400 MG CAPS, Take 1 capsule by mouth daily., Disp: , Rfl:    dextromethorphan-guaiFENesin (MUCINEX DM) 30-600 MG 12hr tablet, Take 2 tablets by mouth 2 (two) times daily.  Taking Mucinex AM/PM twice a day, Disp: , Rfl:    diclofenac (VOLTAREN) 75 MG EC tablet, Take 75 mg by mouth 2 (two) times daily., Disp: , Rfl:    doxycycline (VIBRA-TABS) 100 MG tablet, Take 1 tablet (100 mg total) by mouth 2 (two) times daily., Disp: 14 tablet, Rfl: 0   LORazepam (ATIVAN) 0.5 MG tablet, Take 0.5 mg by mouth at bedtime., Disp: , Rfl:    Magnesium 400 MG CAPS, Take by mouth., Disp: , Rfl:    Multiple Vitamins-Minerals (MULTIVITAMIN WITH MINERALS) tablet, Take 1 tablet by mouth daily., Disp: , Rfl:    predniSONE (DELTASONE) 10 MG tablet, Take 4 tablets (40 mg total) by mouth daily with breakfast for 1 day, THEN 3 tablets (30 mg total) daily with breakfast for 1 day, THEN 2 tablets (20 mg total) daily with breakfast for 1 day, THEN 1 tablet (10 mg total) daily with breakfast for 1 day, THEN 0.5 tablets (5 mg total) daily with breakfast for 1 day., Disp: 11 tablet, Rfl: 0   sertraline (ZOLOFT) 50 MG tablet, Take 75 mg by mouth daily., Disp: , Rfl:    atorvastatin (LIPITOR) 40 MG tablet, Take 1 tablet (40 mg total) by mouth daily., Disp: 90 tablet, Rfl: 3 Laboratory examination:   Lab Results  Component Value Date   CHOL 141 01/02/2023   HDL 50 01/02/2023   LDLCALC 66 01/02/2023   TRIG 148 01/02/2023    Labs 01/02/2023:  CRP, High Sensitivity                        0.00 - 3.00 mg/L                     0.43  Apolipoprotein A-1                            116 - 209 mg/dL                      157 Apolipoprotein B                              <90 dL  62 Lipoprotein (a)                                  <75.0 nmol/L                            36.9  External labs:   Cholesterol, total 174.000 m 02/16/2022 HDL 58.000 mg 02/16/2022 LDL 89.000 mg 02/16/2022 Triglycerides 155.000 m 02/16/2022  A1C 5.800 % 02/16/2022 TSH 0.640 02/16/2022  Hemoglobin 14.500 g/d 02/16/2022  Creatinine, Serum 0.750 mg/ 02/16/2022 Potassium 4.500 mm 02/16/2022 Magnesium  N/D ALT (SGPT) 28.000 U/L 02/16/2022   Radiology:   High-resolution CT chest 08/15/2022: 1. 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 Findings are categorized as probable UIP per consensus guidelines: Diagnosis of Idiopathic Pulmonary Fibrosis: An Official ATS/ERS/JRS/ALAT Clinical Practice Guideline. [place], Iss 5, [phone], Jul 29 2017. 2. Cholelithiasis. 3.  Aortic atherosclerosis (ICD10-I70.0). 4. Enlarged right and left pulmonary arteries, indicative of pulmonary arterial hypertension.  Cardiac Studies:   Echocardiogram 10/03/2022:   1. Left ventricular ejection fraction, by estimation, is 60 to 65%. The left ventricle has normal function. The left ventricle has no regional wall motion abnormalities. Left ventricular diastolic parameters were normal.  2. Right ventricular systolic function is mildly reduced. The right ventricular size is mildly enlarged.  3. The mitral valve is normal in structure. Mild mitral valve regurgitation.  4. The aortic valve is tricuspid. Aortic valve regurgitation is not visualized.  5. Aortic dilatation noted. There is mild dilatation of the ascending aorta, measuring 40 mm.  6. The inferior vena cava is normal in size with greater than 50% respiratory variability, suggesting right atrial pressure of 3 mmHg.  PCV MYOCARDIAL PERFUSION WO LEXISCAN 12/26/2022  Narrative Exercise Sestamibi stress test 12/26/2022: Exercise nuclear stress test was performed using Bruce protocol. Patient reached 5.9 METS, and 104% of age predicted maximum heart rate. Exercise capacity was low. No chest pain reported. Heart rate and hemodynamic response were normal. Stress EKG revealed no ischemic changes. Normal myocardial perfusion. Stress LVEF 69% Small LV cavity can limit assessment. Low risk study.    EKG:   EKG 11/30/2022: Normal sinus rhythm at rate of 85 bpm, incomplete right  bundle branch block.  Low-voltage complexes.  Consider pulmonary disease pattern.    Assessment     ICD-10-CM   1. Dyspnea on exertion  R06.09     2. 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 Pure hypercholesterolemia  E78.00 atorvastatin (LIPITOR) 40 MG tablet    4. Aortic atherosclerosis (HCC)  I70.0        No orders of the defined types were placed in this encounter.   Meds ordered this encounter  Medications   atorvastatin (LIPITOR) 40 MG tablet    Sig: Take 1 tablet (40 mg total) by mouth daily.    Dispense:  90 tablet    Refill:  3    Medications Discontinued During This Encounter  Medication Reason   atorvastatin (LIPITOR) 40 MG tablet Reorder      Recommendations:   Rebecca Huff is a 67 y.o. Caucasian female patient with 40+ pack year history of smoking cigarettes, quit in 2017, referred to me for evaluation of dyspnea on exertion and abnormal CT scan of the chest revealing dilated pulmonary artery and coronary calcification  and aortic atherosclerosis.  Past medical history significant for hypercholesterolemia.  1. Dyspnea on exertion Patient's gradual onset of dyspnea on exertion is related to underlying IPF, was seen by Dr. Chase Caller this morning, pulmonary function test have been fairly stable.  I reviewed the results of the nuclear stress test and again the echocardiogram with the patient, low risk nuclear stress test.  In view of this I will schedule her for right heart catheterization to evaluate for pulmonary hypertension.  Unless right heart catheterization is markedly abnormal, I will see her back in 6 months.  She may need repeat echocardiogram at that time. She also has evidence of right ventricular strain and features suggestive of chronic cor pulmonale.  This appears to be out of proportion to IPF, PFTs revealed mild abnormalities only.  Evaluation for pulm hypertension is indicated.  2. Enlarged pulmonary artery ([place]) This is again suggestive of  pulm hypertension.  I will complete the evaluation once we have right heart catheterization data.  If right heart catheterization does not suggest significant pulm hypertension, I will see her back in 6 months otherwise I will see her back sooner.  Suspect IPF is probably related to prior history of tobacco use disorder and smoking and has 40+ pack year history of smoking cigarettes.  She has remained abstinent.  Weight loss and daily exercise discussed with the patient and her husband.  3. Aortic atherosclerosis (HCC) Surprisingly she has significant amount of aortic atherosclerosis by CT scan that was done previously along with coronary calcification as well.  Will be aggressive with lipid management.  She has 4 cm ascending aortic dilatation, she will probably need annual surveillance either by CT scan or by echocardiogram.  Will make decision as we go along.  4. Pure hypercholesterolemia She is tolerating 40 mg of atorvastatin.  I reviewed the results of the lipid profile testing, LDL is now at goal.  Advance lipid profile including LPA are within normal limits.  Continue present medical therapy for now.   Adrian Prows, MD, [facility] 01/10/2023, 2:16 PM Office: [phone]

## 2023-01-27 ENCOUNTER — Encounter (HOSPITAL_COMMUNITY): Payer: Self-pay | Admitting: Cardiology

## 2023-01-27 ENCOUNTER — Encounter (HOSPITAL_COMMUNITY)
Admission: RE | Disposition: A | Discharge status: Home / Self Care | Payer: Self-pay | Source: Home / Self Care | Attending: Cardiology

## 2023-01-27 ENCOUNTER — Other Ambulatory Visit: Payer: Self-pay

## 2023-01-27 ENCOUNTER — Ambulatory Visit (HOSPITAL_COMMUNITY)
Admission: RE | Admit: 2023-01-27 | Discharge: 2023-01-27 | Disposition: A | Discharge status: Home / Self Care | Payer: BC Managed Care – PPO | Attending: Cardiology | Admitting: Cardiology

## 2023-01-27 DIAGNOSIS — Z87891 Personal history of nicotine dependence: Secondary | ICD-10-CM | POA: Insufficient documentation

## 2023-01-27 DIAGNOSIS — I288 Other diseases of pulmonary vessels: Secondary | ICD-10-CM | POA: Diagnosis not present

## 2023-01-27 DIAGNOSIS — E7800 Pure hypercholesterolemia, unspecified: Secondary | ICD-10-CM | POA: Insufficient documentation

## 2023-01-27 DIAGNOSIS — Z79899 Other long term (current) drug therapy: Secondary | ICD-10-CM | POA: Insufficient documentation

## 2023-01-27 DIAGNOSIS — I700 Atherosclerosis of aorta: Secondary | ICD-10-CM | POA: Insufficient documentation

## 2023-01-27 DIAGNOSIS — R0609 Other forms of dyspnea: Secondary | ICD-10-CM

## 2023-01-27 HISTORY — PX: RIGHT HEART CATH: CATH118263

## 2023-01-27 LAB — POCT I-STAT EG7
Acid-Base Excess: 0 mmol/L (ref 0.0–2.0)
Acid-Base Excess: 0 mmol/L (ref 0.0–2.0)
Acid-Base Excess: 1 mmol/L (ref 0.0–2.0)
Bicarbonate: 25.9 mmol/L (ref 20.0–28.0)
Bicarbonate: 26.2 mmol/L (ref 20.0–28.0)
Bicarbonate: 26.8 mmol/L (ref 20.0–28.0)
Calcium, Ion: 1.28 mmol/L (ref 1.15–1.40)
Calcium, Ion: 1.26 mmol/L (ref 1.15–1.40)
Calcium, Ion: 1.28 mmol/L (ref 1.15–1.40)
HCT: 38 % (ref 36.0–46.0)
HCT: 38 % (ref 36.0–46.0)
HCT: 39 % (ref 36.0–46.0)
Hemoglobin: 12.9 g/dL (ref 12.0–15.0)
Hemoglobin: 12.9 g/dL (ref 12.0–15.0)
Hemoglobin: 13.3 g/dL (ref 12.0–15.0)
O2 Saturation: 68 %
O2 Saturation: 53 %
O2 Saturation: 58 %
Potassium: 4.1 mmol/L (ref 3.5–5.1)
Potassium: 4.5 mmol/L (ref 3.5–5.1)
Potassium: 4.5 mmol/L (ref 3.5–5.1)
Sodium: 143 mmol/L (ref 135–145)
Sodium: 145 mmol/L (ref 135–145)
Sodium: 145 mmol/L (ref 135–145)
TCO2: 27 mmol/L (ref 22–32)
TCO2: 28 mmol/L (ref 22–32)
TCO2: 28 mmol/L (ref 22–32)
pCO2, Ven: 45.5 mmHg (ref 44–60)
pCO2, Ven: 46.5 mmHg (ref 44–60)
pCO2, Ven: 48.4 mmHg (ref 44–60)
pH, Ven: 7.364 (ref 7.25–7.43)
pH, Ven: 7.351 (ref 7.25–7.43)
pH, Ven: 7.358 (ref 7.25–7.43)
pO2, Ven: 37 mmHg (ref 32–45)
pO2, Ven: 30 mmHg — CL (ref 32–45)
pO2, Ven: 32 mmHg (ref 32–45)

## 2023-01-27 LAB — POCT I-STAT, CHEM 8
BUN: 12 mg/dL (ref 8–23)
Calcium, Ion: 1.27 mmol/L (ref 1.15–1.40)
Chloride: 103 mmol/L (ref 98–111)
Creatinine, Ser: 0.6 mg/dL (ref 0.44–1.00)
Glucose, Bld: 105 mg/dL — ABNORMAL HIGH (ref 70–99)
HCT: 40 % (ref 36.0–46.0)
Hemoglobin: 13.6 g/dL (ref 12.0–15.0)
Potassium: 4.7 mmol/L (ref 3.5–5.1)
Sodium: 143 mmol/L (ref 135–145)
TCO2: 29 mmol/L (ref 22–32)

## 2023-01-27 SURGERY — RIGHT HEART CATH

## 2023-01-27 MED ORDER — SODIUM CHLORIDE 0.9% FLUSH: 3.0000 mL | INTRAVENOUS | Status: DC | PRN

## 2023-01-27 MED ORDER — HEPARIN (PORCINE) IN NACL 1000-0.9 UT/500ML-% IV SOLN
INTRAVENOUS | Status: DC | PRN
  Administered 2023-01-27: 500 mL

## 2023-01-27 MED ORDER — SODIUM CHLORIDE 0.9 % IV SOLN: 250.0000 mL | INTRAVENOUS | Status: DC | PRN

## 2023-01-27 MED ORDER — LIDOCAINE HCL (PF) 1 % IJ SOLN
INTRAMUSCULAR | Status: DC | PRN
  Administered 2023-01-27: 2 mL via INTRADERMAL

## 2023-01-27 MED ORDER — SODIUM CHLORIDE 0.9% FLUSH: 3.0000 mL | Freq: Two times a day (BID) | INTRAVENOUS | Status: DC

## 2023-01-27 MED ORDER — LIDOCAINE HCL (PF) 1 % IJ SOLN
INTRAMUSCULAR | Status: AC
  Filled 2023-01-27: qty 30

## 2023-01-27 MED ORDER — SODIUM CHLORIDE 0.9 % IV SOLN: INTRAVENOUS | Status: DC

## 2023-01-27 SURGICAL SUPPLY — 3 items
CATH BALLN WEDGE 5F 110CM (CATHETERS) IMPLANT
PACK CARDIAC CATHETERIZATION (CUSTOM PROCEDURE TRAY) IMPLANT
SHEATH GLIDE SLENDER 4/5FR (SHEATH) IMPLANT

## 2023-01-27 NOTE — Progress Notes (Signed)
Per Dr. Einar Gip, patient can be discharged as soon as she gets back to unit, which was at 1145.

## 2023-01-27 NOTE — Discharge Instructions (Signed)
Remove dressing in 24 hours and then you can get the site wet. Watch for signs of infection (fever, redness, drainage, etc) and bleeding. If so, call doctor. No lifting anything heavier than 10 pounds or soaking site in water (bath, shower, hot tub, etc.) for one week.

## 2023-01-27 NOTE — Interval H&P Note (Signed)
History and Physical Interval Note:  01/27/2023 10:48 AM  Rebecca Huff  has presented today for surgery, with the diagnosis of DOE, shortness of breath.  The various methods of treatment have been discussed with the patient and family. After consideration of risks, benefits and other options for treatment, the patient has consented to  Procedure(s): RIGHT HEART CATH (N/A) as a surgical intervention.  The patient's history has been reviewed, patient examined, no change in status, stable for surgery.  I have reviewed the patient's chart and labs.  Questions were answered to the patient's satisfaction.     Adrian Prows

## 2023-02-09 ENCOUNTER — Encounter: Payer: Self-pay | Admitting: Cardiology

## 2023-02-09 ENCOUNTER — Ambulatory Visit: Payer: BC Managed Care – PPO | Admitting: Cardiology

## 2023-02-09 VITALS — BP 133/82 | HR 86 | Resp 16 | Ht 68.0 in | Wt 201.0 lb

## 2023-02-09 DIAGNOSIS — J84112 Idiopathic pulmonary fibrosis: Secondary | ICD-10-CM | POA: Diagnosis not present

## 2023-02-09 DIAGNOSIS — R0609 Other forms of dyspnea: Secondary | ICD-10-CM

## 2023-02-09 DIAGNOSIS — E7800 Pure hypercholesterolemia, unspecified: Secondary | ICD-10-CM | POA: Diagnosis not present

## 2023-02-09 DIAGNOSIS — I288 Other diseases of pulmonary vessels: Secondary | ICD-10-CM | POA: Diagnosis not present

## 2023-02-09 NOTE — Progress Notes (Signed)
Primary Physician/Referring:  Harlan Stains, MD  Patient ID: Rebecca Huff, female    DOB: [birth year], 67 y.o.   MRN: [id number]  Chief Complaint  Patient presents with   Shortness of Breath   Follow-up    Post cath   HPI:    Rebecca Huff  is a 67 y.o. Caucasian female patient with 40+ pack year history of smoking cigarettes, quit in 2017, presents for evaluation of dyspnea on exertion and abnormal CT scan of the chest revealing dilated pulmonary artery and coronary calcification and aortic atherosclerosis.  Past medical history significant for hypercholesterolemia.  She underwent right heart catheterization on [id number] with no complications.  He has remained stable.  Denies PND or orthopnea.  States that her activity is not significantly limited.  No chest pain or palpitations.    Past Medical History:  Diagnosis Date   Anxiety    Arthritis    Cancer ([place])    skin cancer on anterior left leg   Depression    GERD (gastroesophageal reflux disease)    every now and then and then she takes tums   Past Surgical History:  Procedure Laterality Date   ABDOMINAL HYSTERECTOMY     CESAREAN SECTION     x 2   DILATION AND CURETTAGE OF UTERUS     JOINT REPLACEMENT     MENISCUS REPAIR     left  knee   RIGHT HEART CATH N/A 01/27/2023   Procedure: RIGHT HEART CATH;  Surgeon: Adrian Prows, MD;  Location: [facility];  Service: Cardiovascular;  Laterality: N/A;   TOTAL KNEE ARTHROPLASTY Left 10/29/2013   Dr [name]   TOTAL KNEE ARTHROPLASTY Left 10/29/2013   Procedure: LEFT TOTAL KNEE ARTHROPLASTY;  Surgeon: Garald Balding, MD;  Location: [place];  Service: Orthopedics;  Laterality: Left;   TOTAL KNEE ARTHROPLASTY Right 04/21/2015   Procedure: TOTAL KNEE ARTHROPLASTY;  Surgeon: Garald Balding, MD;  Location: [place];  Service: Orthopedics;  Laterality: Right;   TUBAL LIGATION     Family History  Problem Relation Age of Onset   Hypertension Mother    Heart failure Mother     Heart disease Mother    Other Father        Farming accident   Breast cancer Sister 33   Diabetes Paternal Grandmother    Diabetes Paternal Grandfather     Social History   Tobacco Use   Smoking status: Former    Packs/day: 1.00    Years: 35.00    Additional pack years: 0.00    Total pack years: 35.00    Types: Cigarettes    Quit date: 2017    Years since quitting: 7.2   Smokeless tobacco: Never  Substance Use Topics   Alcohol use: Yes    Alcohol/week: 4.0 standard drinks of alcohol    Types: 4 Glasses of wine per week    Comment: a couple glasses a week   Marital Status: Married  ROS  Review of Systems  Cardiovascular:  Positive for dyspnea on exertion. Negative for chest pain and leg swelling.   Objective      02/09/2023   11:26 AM 01/27/2023   12:11 PM 01/27/2023   11:56 AM  Vitals with BMI  Height '5\' 8"'$     Weight 201 lbs    BMI 123XX123    Systolic Q000111Q Q000111Q 123XX123  Diastolic 82 81 88  Pulse 86 63 71    Physical Exam Constitutional:  Appearance: She is obese.  Neck:     Vascular: No carotid bruit or JVD.  Cardiovascular:     Rate and Rhythm: Normal rate and regular rhythm.     Pulses: Intact distal pulses.     Heart sounds: Normal heart sounds. No murmur heard.    No gallop.  Pulmonary:     Effort: Pulmonary effort is normal.     Breath sounds: Normal breath sounds.  Abdominal:     General: Bowel sounds are normal.     Palpations: Abdomen is soft.  Musculoskeletal:     Right lower leg: No edema.     Left lower leg: No edema.     Medications and allergies  No Known Allergies   Medication list   Current Outpatient Medications:    aspirin EC 81 MG tablet, Take 81 mg by mouth daily. Swallow whole., Disp: , Rfl:    atorvastatin (LIPITOR) 40 MG tablet, Take 1 tablet (40 mg total) by mouth daily., Disp: 90 tablet, Rfl: 3   Coenzyme Q10 (COQ-10) 400 MG CAPS, Take 400 mg by mouth daily., Disp: , Rfl:    dextromethorphan-guaiFENesin (MUCINEX DM) 30-600 MG  12hr tablet, Take 2 tablets by mouth 2 (two) times daily as needed for cough. Taking Mucinex AM/PM twice a day as needed, Disp: , Rfl:    diclofenac (VOLTAREN) 75 MG EC tablet, Take 75 mg by mouth 2 (two) times daily., Disp: , Rfl:    LORazepam (ATIVAN) 0.5 MG tablet, Take 0.5 mg by mouth at bedtime., Disp: , Rfl:    Magnesium 400 MG CAPS, Take 400 mg by mouth every evening., Disp: , Rfl:    Multiple Vitamins-Minerals (MULTIVITAMIN WITH MINERALS) tablet, Take 1 tablet by mouth daily., Disp: , Rfl:    sertraline (ZOLOFT) 50 MG tablet, Take 75 mg by mouth daily., Disp: , Rfl:  Laboratory examination:   Lab Results  Component Value Date   CHOL 141 01/02/2023   HDL 50 01/02/2023   LDLCALC 66 01/02/2023   TRIG 148 01/02/2023    Labs 01/02/2023:  CRP, High Sensitivity                        0.00 - 3.00 mg/L                     0.43  Apolipoprotein A-1                            116 - 209 mg/dL                      157 Apolipoprotein B                              <90 dL                                       62 Lipoprotein (a)                                  <75.0 nmol/L                            36.9  External labs:   Cholesterol, total 174.000 m 02/16/2022 HDL 58.000 mg 02/16/2022 LDL 89.000 mg 02/16/2022 Triglycerides 155.000 m 02/16/2022  A1C 5.800 % 02/16/2022 TSH 0.640 02/16/2022  Hemoglobin 14.500 g/d 02/16/2022  Creatinine, Serum 0.750 mg/ 02/16/2022 Potassium 4.500 mm 02/16/2022 Magnesium N/D ALT (SGPT) 28.000 U/L 02/16/2022   Radiology:   High-resolution CT chest 08/15/2022: 1. 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 Findings are categorized as probable UIP per consensus guidelines: Diagnosis of Idiopathic Pulmonary Fibrosis: An Official ATS/ERS/JRS/ALAT Clinical Practice Guideline. [place], Iss 5, [phone], Jul 29 2017. 2. Cholelithiasis. 3.  Aortic atherosclerosis (ICD10-I70.0). 4.  Enlarged right and left pulmonary arteries, indicative of pulmonary arterial hypertension.  Cardiac Studies:   Echocardiogram 10/03/2022:   1. Left ventricular ejection fraction, by estimation, is 60 to 65%. The left ventricle has normal function. The left ventricle has no regional wall motion abnormalities. Left ventricular diastolic parameters were normal.  2. Right ventricular systolic function is mildly reduced. The right ventricular size is mildly enlarged.  3. The mitral valve is normal in structure. Mild mitral valve regurgitation.  4. The aortic valve is tricuspid. Aortic valve regurgitation is not visualized.  5. Aortic dilatation noted. There is mild dilatation of the ascending aorta, measuring 40 mm.  6. The inferior vena cava is normal in size with greater than 50% respiratory variability, suggesting right atrial pressure of 3 mmHg.  PCV MYOCARDIAL PERFUSION WO LEXISCAN 12/26/2022  Narrative Exercise Sestamibi stress test 12/26/2022: Exercise nuclear stress test was performed using Bruce protocol. Patient reached 5.9 METS, and 104% of age predicted maximum heart rate. Exercise capacity was low. No chest pain reported. Heart rate and hemodynamic response were normal. Stress EKG revealed no ischemic changes. Normal myocardial perfusion. Stress LVEF 69% Small LV cavity can limit assessment. Low risk study.   Right Heart Catheterization 01/27/23  Procedural data:  RA pressure 7/5  Mean 3 mm mercury. RA saturation 68%.  RV pressure 21/1 and Right ventricular EDP 6 mm Hg. PA pressure 23/9 with a mean of 16  mm mercury. PA saturation 55%.  PVR 2.25 wood Units.  Pulmonary capillary wedge 12/11 with a mean of 7 mm Hg. Aortic saturation 100%.  Cardiac output was 4.6 with cardiac index of 2.25  by Fick.  QP/QS 0.71.  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   Impression:   Normal right heart catheterizaton with preserved cardiac output and  cardiac index. Qp:Qs ratio <1.1 suggest probable right to left shunting and may indicate intrapulmonary or extracardiac arteriovenous shunting, further evaluation for dyspnea may include evaluation for the above in the differential diagnosis.   EKG:   EKG 11/30/2022: Normal sinus rhythm at rate of 85 bpm, incomplete right bundle branch block.  Low-voltage complexes.  Consider pulmonary disease pattern.    Assessment     ICD-10-CM   1. Dyspnea on exertion  R06.09     2. 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 IPF (idiopathic pulmonary fibrosis) ([place])  J84.112     4. Pure hypercholesterolemia  E78.00       No orders of the defined types were placed in this encounter.  No orders of the defined types were placed in this encounter.  There are no discontinued medications.     Recommendations:   MIRAE LENTON is a 67 y.o. Caucasian female patient with 40+ pack year history of  smoking cigarettes, quit in 2017, presents for evaluation of dyspnea on exertion and abnormal CT scan of the chest revealing dilated pulmonary artery and coronary calcification and aortic atherosclerosis.  Past medical history significant for hypercholesterolemia.  She underwent right heart catheterization on 123XX123 with no complications.  1. Dyspnea on exertion Patient's dyspnea on exertion is very stable.  She has not had any signs of right-sided heart failure.  I reviewed the results of the right heart catheterization, there was no pulmonary hypertension.  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  For completion of workup, during her next high-resolution CT, we could consider doing pulmonary angiogram to exclude significant AV malformations.  However as long as she remains stable without clinical evidence of right-sided heart failure, dyspnea remained stable, this is only optional.  I am not very hard-pressed for pursuing this however I  will leave it to Dr. Chase Caller to decide whether he would like to pursue CT angiography.  2. Enlarged pulmonary artery (HCC) Enlarged pulmonary artery is probably just a combination of altered structure due to IPF.  Fortunately no evidence of pulm hypertension.  3. IPF (idiopathic pulmonary fibrosis) ([place]) This is being managed very closely by Dr. Chase Caller.  4.  Pure hypercholesterolemia She will follow-up with Dr. Harlan Stains for further management of hypercholesterolemia.  She is now tolerating 40 mg of Lipitor.  This was done in view of aortic atherosclerosis.  Patient has had a negative and low risk nuclear stress test, she is already on high intensity statin, for aortic atherosclerosis she is on aspirin as well for prophylaxis.  Overall she is on appropriate medical therapy, I will see him back on a as needed basis.    Adrian Prows, MD, [facility] 02/09/2023, 12:13 PM Office: [phone]

## 2023-03-06 ENCOUNTER — Telehealth: Payer: Self-pay | Admitting: Internal Medicine

## 2023-03-06 NOTE — Telephone Encounter (Signed)
Called and spoke with patient. Patient stated she has a sore throat, drainage, and her face hurts. She thinks it's a sinus infection. Patient stated that her cough is productive but not all the time. She also stated that she's not coughing up any phlegm. Patient wants to know if we can call something in for her.   MR, please advise.

## 2023-03-06 NOTE — Telephone Encounter (Signed)
Patient states having symptoms of cough and sore throat. Would like antiobotic and prednisone called into pharmacy. Pharmacy is [facility] [address]. Patient phone number is [phone].

## 2023-03-06 NOTE — Telephone Encounter (Signed)
  She has acute sinusitis  Plan  - Take doxycycline 100mg  po twice daily x 5 days; take after meals and avoid sunlight - if things get worse can call for a prednisone   No Known Allergies      has a past medical history of Anxiety, Arthritis, Cancer (HCC), Depression, and GERD (gastroesophageal reflux disease).   reports that she quit smoking about 7 years ago. Her smoking use included cigarettes. She has a 35.00 pack-year smoking history. She has never used smokeless tobacco.  Past Surgical History:  Procedure Laterality Date   ABDOMINAL HYSTERECTOMY     CESAREAN SECTION     x 2   DILATION AND CURETTAGE OF UTERUS     JOINT REPLACEMENT     MENISCUS REPAIR     left  knee   RIGHT HEART CATH N/A 01/27/2023   Procedure: RIGHT HEART CATH;  Surgeon: Yates Decamp, MD;  Location: [facility] INVASIVE CV LAB;  Service: Cardiovascular;  Laterality: N/A;   TOTAL KNEE ARTHROPLASTY Left 10/29/2013   Dr [name]   TOTAL KNEE ARTHROPLASTY Left 10/29/2013   Procedure: LEFT TOTAL KNEE ARTHROPLASTY;  Surgeon: Valeria Batman, MD;  Location: [facility] OR;  Service: Orthopedics;  Laterality: Left;   TOTAL KNEE ARTHROPLASTY Right 04/21/2015   Procedure: TOTAL KNEE ARTHROPLASTY;  Surgeon: Valeria Batman, MD;  Location: [facility] OR;  Service: Orthopedics;  Laterality: Right;   TUBAL LIGATION      No Known Allergies  Immunization History  Administered Date(s) Administered   Influenza,inj,Quad PF,6+ Mos 07/29/2021    Family History  Problem Relation Age of Onset   Hypertension Mother    Heart failure Mother    Heart disease Mother    Other Father        Farming accident   Breast cancer Sister 50   Diabetes Paternal Grandmother    Diabetes Paternal Grandfather      Current Outpatient Medications:    aspirin EC 81 MG tablet, Take 81 mg by mouth daily. Swallow whole., Disp: , Rfl:    atorvastatin (LIPITOR) 40 MG tablet, Take 1 tablet (40 mg total) by mouth daily., Disp: 90 tablet, Rfl: 3   Coenzyme Q10  (COQ-10) 400 MG CAPS, Take 400 mg by mouth daily., Disp: , Rfl:    dextromethorphan-guaiFENesin (MUCINEX DM) 30-600 MG 12hr tablet, Take 2 tablets by mouth 2 (two) times daily as needed for cough. Taking Mucinex AM/PM twice a day as needed, Disp: , Rfl:    diclofenac (VOLTAREN) 75 MG EC tablet, Take 75 mg by mouth 2 (two) times daily., Disp: , Rfl:    LORazepam (ATIVAN) 0.5 MG tablet, Take 0.5 mg by mouth at bedtime., Disp: , Rfl:    Magnesium 400 MG CAPS, Take 400 mg by mouth every evening., Disp: , Rfl:    Multiple Vitamins-Minerals (MULTIVITAMIN WITH MINERALS) tablet, Take 1 tablet by mouth daily., Disp: , Rfl:    sertraline (ZOLOFT) 50 MG tablet, Take 75 mg by mouth daily., Disp: , Rfl:

## 2023-03-06 NOTE — Telephone Encounter (Signed)
Called the pt back and there was no answer- LMTCB and will hold until she calls back and then send in meds.

## 2023-03-07 ENCOUNTER — Ambulatory Visit: Payer: BC Managed Care – PPO | Admitting: Internal Medicine

## 2023-03-07 NOTE — Telephone Encounter (Signed)
Patient stated she is returning a call regarding medication for her cough.  She missed the call yesterday.  Please call patient back at [phone]

## 2023-03-08 ENCOUNTER — Other Ambulatory Visit: Payer: Self-pay | Admitting: Family Medicine

## 2023-03-08 DIAGNOSIS — M8580 Other specified disorders of bone density and structure, unspecified site: Secondary | ICD-10-CM

## 2023-03-08 NOTE — Telephone Encounter (Signed)
Called pt and there was no answer- LMTCB again.

## 2023-03-08 NOTE — Telephone Encounter (Signed)
Called and spoke with patient, provided recommendations per Dr. Marchelle Gearing, she stated that she had her yearly physical with Dr. Cliffton Asters her PCP and they were sending something in so she had it covered.    I called and spoke with her pharmacy, [facility] and they verified that Doxycycline had been sent in for her by her PCP.  Nothing further needed.

## 2023-03-17 ENCOUNTER — Other Ambulatory Visit: Payer: Self-pay | Admitting: Family Medicine

## 2023-03-17 DIAGNOSIS — M8580 Other specified disorders of bone density and structure, unspecified site: Secondary | ICD-10-CM

## 2023-05-11 ENCOUNTER — Ambulatory Visit: Payer: BC Managed Care – PPO | Admitting: Internal Medicine

## 2023-07-04 ENCOUNTER — Ambulatory Visit (INDEPENDENT_AMBULATORY_CARE_PROVIDER_SITE_OTHER): Payer: Medicare Other | Admitting: Internal Medicine

## 2023-07-04 ENCOUNTER — Encounter: Payer: Self-pay | Admitting: Internal Medicine

## 2023-07-04 VITALS — BP 120/70 | HR 79 | Ht 68.0 in | Wt 206.2 lb

## 2023-07-04 DIAGNOSIS — Z122 Encounter for screening for malignant neoplasm of respiratory organs: Secondary | ICD-10-CM

## 2023-07-04 DIAGNOSIS — Z8616 Personal history of COVID-19: Secondary | ICD-10-CM

## 2023-07-04 DIAGNOSIS — J849 Interstitial pulmonary disease, unspecified: Secondary | ICD-10-CM | POA: Diagnosis not present

## 2023-07-04 DIAGNOSIS — Z87891 Personal history of nicotine dependence: Secondary | ICD-10-CM

## 2023-07-04 DIAGNOSIS — J84112 Idiopathic pulmonary fibrosis: Secondary | ICD-10-CM

## 2023-07-04 LAB — PULMONARY FUNCTION TEST
DL/VA % pred: 113 %
DL/VA: 4.62 ml/min/mmHg/L
DLCO cor % pred: 84 %
DLCO cor: 18.77 ml/min/mmHg
DLCO unc % pred: 84 %
DLCO unc: 18.77 ml/min/mmHg
FEF 25-75 Pre: 2.69 L/sec
FEF2575-%Pred-Pre: 115 %
FEV1-%Pred-Pre: 79 %
FEV1-Pre: 2.21 L
FEV1FVC-%Pred-Pre: 109 %
FEV6-%Pred-Pre: 75 %
FEV6-Pre: 2.61 L
FEV6FVC-%Pred-Pre: 103 %
FVC-%Pred-Pre: 72 %
FVC-Pre: 2.61 L
Pre FEV1/FVC ratio: 85 %
Pre FEV6/FVC Ratio: 100 %

## 2023-07-04 NOTE — Patient Instructions (Addendum)
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

## 2023-07-04 NOTE — Progress Notes (Signed)
OV 07/22/2022  Subjective:  Patient ID: Rebecca Huff, female , DOB: [birth year] , age 67 y.o. , MRN: [id number] , ADDRESS: [address] [place] [address] PCP Laurann Montana, MD Patient Care Team: Laurann Montana, MD as PCP - General (Family Medicine)  This Provider for this visit: Treatment Team:  Attending Provider: Kalman Shan, MD    07/22/2022 -   Chief Complaint  Patient presents with   Consult    Pt had a recent CT performed which is the reason for today's visit. Pt states she has an occasional tickle in her throat and states the only time she has any SOB is when she goes upstairs.     Rebecca Rebecca Huff 67 y.o. -referred by Dr. Severiano [facility].  Patient has been getting annual CT scans low-dose CT scan of the chest for lung cancer screening since 2018.  She reports that the most recent one in May 2023 was reported as abnormal and therefore she had a high-resolution CT chest and has been referred here.  However my personal visualization I believe there was onset of potential ILD versus bibasilar atelectasis in April 2022.  In November 2022 she suffered COVID-19.  She states the symptoms were quite severe and she was coughing for 3 weeks.  She did take antiviral.  She was not hospitalized.  Then after that she started developing Hartness of breath particular for the last 6 to 8 months.  She did have a CT scan of the chest in May 2023 low-dose where in my personal visualization of the ILD changes in the lung base look more prominent.  This was followed by high-resolution CT chest that I personally visualized.  Only supine images.  Is not prone.  She is overweight and has gained 7 pounds in the last few years.  Again shows more prominent ILD changes versus bibasal atelectasis.  Definitely she is got shortness of breath in the last 6-8 months but she feels it is stable and is only mild and it is first-aid case.   [facility] Comprehensive ILD  Questionnaire  Symptoms:   Past Medical History :  -Has osteoarthritis.  Shoulder pain and neck pain -No collagen vascular disease - Did have outpatient COVID in November 2022 - No kidney disease no heart disease no lung disease otherwise.  No blood clots no Lucy   ROS:  -Nonspecific joint pain with arthralgia consistent with osteoarthritis - Has history of snoring for the last few decades  FAMILY HISTORY of LUNG DISEASE:  Denies*  PERSONAL EXPOSURE HISTORY:  Smoke cigarettes 50 1977 in 20 1720 cigarettes/day and quit.  No marijuana use no cocaine use no intravenous drug use.  HOME  EXPOSURE and HOBBY DETAILS :  [place]  Has been living there for 6 years.  Detail organic antigen exposure history in the house is negative.  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 Concern for interstitial lung disease  on recent screening chest CT.   EXAM: CT CHEST WITHOUT CONTRAST   TECHNIQUE: Multidetector CT imaging of the chest was performed following the standard protocol without intravenous contrast. High resolution imaging of the lungs, as well as inspiratory and expiratory imaging, was performed.   RADIATION DOSE REDUCTION: This exam was performed according to the departmental dose-optimization program which includes automated exposure control, adjustment of the mA and/or kV according to patient size and/or use of iterative reconstruction technique.    COMPARISON:  04/04/2022 screening chest CT.   FINDINGS: Cardiovascular: Normal heart size. No significant pericardial effusion/thickening. Atherosclerotic nonaneurysmal thoracic aorta. Normal caliber pulmonary arteries.   Mediastinum/Nodes: Hypodense 1.3 cm right thyroid nodule is unchanged. Not clinically significant; no follow-up imaging recommended (ref: J Am Coll Radiol. 2015 Feb;12(2): 143-50). Unremarkable esophagus. No pathologically enlarged axillary, mediastinal or hilar lymph nodes, noting limited sensitivity for the detection of hilar adenopathy on this noncontrast study.   Lungs/Pleura: No pneumothorax. No pleural effusion. No acute consolidative airspace disease, lung masses or significant pulmonary nodules. Calcified 4 mm granuloma in the basilar right lower lobe is unchanged. Very mild patchy air trapping in both lungs on the expiration sequence without definite evidence of tracheobronchomalacia. Mild-to-moderate patchy confluent subpleural reticulation and ground-glass opacity in both lungs with a strong posterior lower lobe predominance. No significant regions of traction bronchiectasis, architectural distortion or frank honeycombing. Findings have increased since the baseline 05/08/2017 screening chest CT study and appear mildly increased since 02/27/2021 chest CT.   Upper abdomen: Cholelithiasis.   Musculoskeletal: No aggressive appearing focal osseous lesions. Mild thoracic spondylosis.   IMPRESSION: 1. Mild-to-moderate patchy confluent subpleural reticulation and ground-glass opacity in both lungs with a strong posterior lower lobe predominance. No significant traction bronchiectasis or frank honeycombing. Findings have increased since the baseline 2018 screening chest CT study and appear mildly increased since 02/27/2021 chest CT. Very mild patchy air trapping in both lungs, indicative of small airways disease. Findings are considered indeterminate for  interstitial lung disease such as NSIP or UIP versus a combination of hypoventilatory change and nonspecific bland postinfectious/postinflammatory scarring. Follow-up high-resolution chest CT in 12 months with prone imaging may be considered. Findings are indeterminate for UIP per consensus guidelines: Diagnosis of Idiopathic Pulmonary Fibrosis: An Official ATS/ERS/JRS/ALAT Clinical Practice Guideline. Am Rosezetta Schlatter Crit Care Med Vol 198, Iss 5, [phone], Jul 29 2017. 2. Cholelithiasis. 3. Aortic Atherosclerosis (ICD10-I70.0).     Electronically Signed   By: Delbert Phenix M.D.   On: 06/09/2022 09:36         09/07/2022: Today - follow up Patient presents today for follow-up to discuss pulmonary function testing and high-resolution CT scan results.  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  Her pulmonary function testing was overall normal with no formal restrictive or obstructive defect.  She reports that she has been stable since we saw her last.  No increased shortness of breath from her baseline.  She really only gets winded when she gets in a hurry and is doing multiple different things at 1 time.  Otherwise, she does not really notice any any negative impact on her breathing.  She walks for exercise on a regular basis.  Denies any significant cough, chest congestion, wheezing.  She did have some findings of enlarged pulmonary artery on her CT scan.  DLCO was normal and pulmonary function testing.  She denies any lower extremity swelling, PND or orthopnea. She tells me that she does have some trouble with sleeping at night.  After she  was told that she had drops in her oxygen, she stopped using her Xanax and gabapentin.  She wakes frequently throughout the night.  She also feels like she does not sleep as soundly.  She has been told in the past that she snores.  Wakes up in the morning feeling poorly rested and has daytime fatigue symptoms.  She also has morning headaches.  She denies any  witnessed apneas, sleep parasomnia/paralysis, or history of narcolepsy or cataplexy.  She is wearing 1 L supplemental O2 at night; does not like wearing it.  Wants to know if she can come off of it.  TEST/EVENTS:  07/28/2022: Negative autoimmune panel 08/03/2022 HRCT chest: Mild pulmonary parenchymal pattern of ILD, categorized as probable UIP.  Enlarged right and left pulmonary arteries, consistent with pulmonary arterial hypertension. 08/08/2022 ONO 23 min spent <88%; average 91.6% and SpO2 low 81% 09/06/2022 PFTs: FVC 75, FEV1 80, ratio 87, TLC 88, DLCOcor 81  OV 09/15/2022  Subjective:  Patient ID: Rebecca Huff, female , DOB: 1956/08/02 , age 24 y.o. , MRN: [id number] , AD[address]ESS: [address] [place] [address] PCP Laurann Montana, MD Patient Care Team: Laurann Montana, MD as PCP - General (Family Medicine)  This Provider for this visit: Treatment Team:  Attending Provider: Kalman Shan, MD    09/15/2022 -   Chief Complaint  Patient presents with   Follow-up    Discuss PFT from 09/06/22. Snoring improved.   ILD -subtle since 2017/2018 through 2023 with normal pulmonary function testing except for FVC reduction based on obesity.  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   Former smoker   -No description of emphysema on CT chest but on empiric Spiriva.  Arctic valve calcification on CT scan of the chest 2023 with enlarged pulmonary artery  Rebecca Rebecca Huff 67 y.o. -turns for follow-up.  She presents with her husband.  I am meeting him for the first time.  She tells me that she is doing well.  Nurse practitioner gave her empiric Spiriva.  She is not sure if it is helping her.  She thinks it might be helping her.  Symptom score seems somewhat better.  She is willing to stop it.  She is using nighttime oxygen but its  not helping its actually making her tired.  She wants to stop it.   She is here to discuss findings of the CT scan of the chest and work-up so far.  We visualized the CT scans all the way from 2018 through 2023.  In my personal professional opinion I do not think there is progression.  However the radiologist thinks there is progression.  I do agree with the findings probable UIP although it could easily be indeterminate for UIP.  She does have some crackles at the base.  Given age greater than 60 and crackles in definite ILD findings with subpleural reticulation there is some 20% chance of progression over the next few to several years.  If it is probable UIP then per ATS we can call this is IPF and be more confident about progression.  If it is indeterminate then she will require about lung biopsy.  I discussed these possibilities with her but she is somewhat reluctant to have biopsy at this point.  Also she is not too enthusiastic about starting antifibrotic treatment given  the side effect profile and the fact that she is feeling well right now.  Did discuss about the unpredictable nature of ILD's in the future risk of progression although it is reassuring that so far in the last 4 to 5 years even if there is progression her DLCO still normal.  We resolved that we will discuss this in the case conference and then make a decision.  In addition the CT scan of the chest shows aortic valve calcification and enlarged pulmonary arteries she has not had an echocardiogram before.  She has agreed to get this.     CT Chest data - HRCT 08/13/22  Narrative & Impression  CLINICAL DATA:  Interstitial lung disease. Only prone imaging requested. Shortness of breath with exertion.   EXAM: CT CHEST WITHOUT CONTRAST   TECHNIQUE: Multidetector CT imaging of the chest was performed following the standard protocol without intravenous contrast. High resolution imaging of the lungs, as well as inspiratory and  expiratory imaging, was performed.   RADIATION DOSE REDUCTION: This exam was performed according to the departmental dose-optimization program which includes automated exposure control, adjustment of the mA and/or kV according to patient size and/or use of iterative reconstruction technique.   COMPARISON:  06/07/2022, 04/04/2022, 02/27/2021.   FINDINGS: Cardiovascular: Atherosclerotic calcification of the aorta and aortic valve. Enlarged right and left pulmonary arteries. Heart size at the upper limits of normal. No pericardial or pleural effusion. Distal esophagus is unremarkable.   Mediastinum/Nodes: No pathologically enlarged mediastinal or axillary lymph nodes. Hilar regions are difficult to definitively evaluate without IV contrast. Esophagus is grossly unremarkable.   Lungs/Pleura: Mild peripheral and basilar predominant subpleural reticulation, ground-glass and traction bronchiectasis/bronchiolectasis. Findings were present on prior exams but 1:1 comparison is challenging due to prone imaging on today's study. No pleural fluid. Mild nodularity along the ventral and lateral walls of the trachea can be seen with tracheobronchopathia osteochondroplastica. No air trapping.   Upper Abdomen: Visualized portion of the liver is unremarkable. Stones in the gallbladder. Visualized portions of the adrenal glands, left kidney, spleen, pancreas, stomach and bowel are grossly unremarkable.   Musculoskeletal: Degenerative changes in the spine. No worrisome lytic or sclerotic lesions.   IMPRESSION: 1. 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 Findings are categorized as probable UIP per consensus guidelines: Diagnosis of Idiopathic Pulmonary Fibrosis: An Official ATS/ERS/JRS/ALAT Clinical Practice Guideline. Am Rosezetta Schlatter Crit Care Med Vol 198, Iss 5, [phone], Jul 29 2017. 2. Cholelithiasis. 3.  Aortic atherosclerosis  (ICD10-I70.0). 4. Enlarged right and left pulmonary arteries, indicative of pulmonary arterial hypertension.     Electronically Signed   By: Leanna Battles M.D.   On: 08/15/2022 14:04          OV 11/10/2022  Subjective:  Patient ID: Rebecca Huff, female , DOB: 1956/08/05 , age 55 y.o. , MRN: [id number] , AD[address]ESS: [address] [place] [address] PCP Laurann Montana, MD Patient Care Team: Laurann Montana, MD as PCP - General (Family Medicine)  This Provider for this visit: Treatment Team:  Attending Provider: Kalman Shan, MD  11/10/2022 -  ILD workup in pro[place]s   Type of visit: Video Virtual Visit Identification of patient Rebecca Huff with 04-07-1956 and MRN [id number] - 2 person identifier Risks: Risks, benefits, limitations of telephone visit explained. Patient understood and verbalized agreement to proceed Anyone else on call: ust patient Patient location: [facility] her Mikeal Hawthorne is in clinic This provider location: [address]; [place]; [place] [address]. [facility]. [phone]     Rebecca Rebecca Huff 67 y.o. -presents for follow-up.  Is a video visit to discuss outcome of her case conference.  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  It is early and mild.  There is also agreement that there was progression.  I shared these findings with her.  I said the clinical pretest probably to hear his IPF.  I did indicate to her that if it is IPF it means that she is likely to get worse with time over the next few or several years.  With early disease it might even take 7 to 10 years.  On the other hand she could rapidly decline in just a few years.  I did indicate to her that there are 3 options which includes waiting and watching for development of further progression.  I recommended against this.  The other option is to commit to antifibrotic's.  I did indicate the general  side effect nature and the properties of antifibrotic's.  The other option is to participate in clinical trial.  The other option is to go ahead with a lung biopsy to confirm the diagnosis.  She is choosing between lung biopsy versus antifibrotic/clinical trial.  Overall the conference felt quite confident about probable UIP and per latest ATS criteria this would just become IPF without having to undergo lung biopsy.  Did indicate to her that not urgent but it is important in the next few months we get the sorted out.  Did indicate to her that she needs to protect herself from respiratory infections.   We discussed echo report whether some amount of RV strain.  There is also enlarged pulmonary artery on the CT scan.  There is aortic root dilatation.  She is never seen a cardiologist.  I told her I would refer her to 1.    OV 01/10/2023  Subjective:  Patient ID: Rebecca Huff, female , DOB: 1956-07-19 , age 67 y.o. , MRN: [id number] , AD[address]ESS: [address] [place] [address] PCP Laurann Montana, MD Patient Care Team: Laurann Montana, MD as PCP - General (Family Medicine) Kalman Shan, MD as Consulting Physician (Pulmonary Disease)  This Provider for this visit: Treatment Team:  Attending Provider: Kalman Shan, MD    01/10/2023 -   Chief Complaint  Patient presents with   Follow-up    Sinus congestion and cough x 1 week.  Did see Dr. Jacinto Halim. Had stress test.  Review PFT from 11/09/23.   Rebecca Rebecca Huff 67 y.o. -returns with her husband.  He is an independent historian.  She feels stable.  Symptom score below shows stability.  In the interim she has seen Dr. Jacinto Halim for her aortic valve calcification and enlarged pulmonary artery.  In addition review of the records indicate he was diagnosed with a 4 cm ascending aortic aneurysm.  He is contemplating a nuclear stress test and then deciding whether she needs left versus right heart catheterization or just right heart  catheterization only.  She has follow-up visit with him pending.  In terms of ILD she continues to be stable.  We again visualized the images.  My personal professional opinion the onset of ILD was somewhere in 2020.  In 2017 and 2018 she might not have had ILD.  And since 2028 tad worse perhaps.  Radiology conference definitely felt it was worse.  Still her pulmonary function test today is still normal and in the exam if she has crackles then that she has very minimal crackles on the right base.  She and I and her husband we discussed options in a care which include continued monitoring versus antifibrotic therapy initiation right now.  We discussed the side effects of antifibrotic therapy though the reversible are unpleasant and can be unpleasant occasionally or periodically.  Some of the data indicate that after a few years more than 50% of patients give up on these medications.  At the same time that the patient is tolerating these medications for many years.  We discussed about getting biopsy and the 2 biopsy methods including bronchoscopy without any genomic classifier.  This is the limitations of this.  Discussed surgical lung biopsy.  Did indicate that the reason for the biopsy would be to diagnose UIP and UIP would be a negative prognostic sign and rationale for biopsy would be intention to treat early with antifibrotic's.  She did indicate that the presence of these findings do make her anxious.  Her husband prefers a slightly more conservative approach.  We resolved that we would reassess again in a few months with pulmonary function test.  This would give her time to finish her cardiac workup.  Did indicate that my bias would be to at least get a bronchoscopy with lavage and transbronchial biopsies for genomic classifier done.  Along with a MDD this would increase the sensitivity and specificity in diagnosing UIP/IPF.  And the risk would be much less compared to surgical lung biopsy.  She is  understanding and accepting of this approach.  Of note for the last week or so she has had sinus complaints.  She thinks it is because of the weather no sick contacts.  There is occasional yellow drainage but mostly clear.  She feels congested.  In the interim no other medical issues.  She feels she might benefit from antibiotic and prednisone course.   OV 07/04/2023  Subjective:  Patient ID: Rebecca Huff, female , DOB: 1956/10/14 , age 54 y.o. , MRN: [id number] , AD[address]ESS: [address] [place] [address] PCP Laurann Montana, MD Patient Care Team: Laurann Montana, MD as PCP - General (Family Medicine) Kalman Shan, MD as Consulting Physician (Pulmonary Disease)  This Provider for this visit: Treatment Team:  Attending Provider: Kalman Shan, MD    ILD -subtle since 2017/2018 through 2023 with normal pulmonary function testing except for FVC reduction based on obesity.  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  Former smoker   -No description of emphysema on CT chest but on empiric Spiriva.  Arctic valve calcification on CT scan of the chest 2023 with enlarged pulmonary artery  07/04/2023 -   Chief Complaint  Patient presents with   Follow-up    F/up on PFT, no complaints     Rebecca Huff 67 y.o. -presents for follow-up of very mild ILD.  She states that since her last visit she has had some respiratory exacerbations and then she had cough with clearing of the throat.  She did require some antibiotics.  But currently she  is maintained both H2 blockade and PPI and with this the cough and the clearing of the throat improved  significantly.  Other than this Interim Health status: No new complaints No new medical problems. No new surgeries. No ER visits. No Urgent care visits. No changes to medications. At this point in time she prefers continued follow-up.  Not interested in biopsy.  She not interested in antifibrotic's.  She did indicate to me that she would be interested in getting a CT scan and follow-up.  Review of the charts indicate that she did have a significant smoking history.  1 year CT scan time would be September 2024 because the last one in September 2023.  She is agreed to have a high-resolution CT chest within the next 6 months when she comes back for follow-up.    SYMPTOM SCALE - ILD 07/22/2022 09/15/2022 spiriva 01/11/2023 Supporitve care 07/04/2023   Current weight      O2 use ra ra ra ra  Shortness of Breath 0 -> 5 scale with 5 being worst (score 6 If unable to do)     At rest 0 0 0 0  Simple tasks - showers, clothes change, eating, shaving 0 0 0 0  Household (dishes, doing bed, laundry) 1 0 1 1  Shopping 0 0 0 0  Walking level at own pace 0 0 0 0  Walking up Stairs 2 1 2 1   Total (30-36) Dyspnea Score 3 1 3 2       Non-dyspnea symptoms (0-> 5 scale) 07/22/2022 09/15/2022  01/11/2023  07/04/2023   How bad is your cough? 0 0 1 0  How bad is your fatigue 000 0 2 0  How bad is nausea 0 0 0 0  How bad is vomiting?  00 0 0 0  How bad is diarrhea? 0 0 0 0  How bad is anxiety? 0 0 1 0  How bad is depression 0 2 1 0  Any chronic pain - if so where and how bad 0 0 x 0      PFT    Latest Ref Rng & Units 07/04/2023   10:50 AM 01/09/2023   10:17 AM 09/06/2022    9:04 AM  PFT Results  FVC-Pre L 2.61  P 2.90  2.82   FVC-Predicted Pre % 72  P 79  75   FVC-Post L   2.87   FVC-Predicted Post %   76   Pre FEV1/FVC % % 85  P 82  82   Post FEV1/FCV % %   87   FEV1-Pre L 2.21  P 2.38  2.33   FEV1-Predicted Pre % 79  P 85  80   FEV1-Post L   2.48   DLCO uncorrected ml/min/mmHg 18.77  P 17.67   18.86   DLCO UNC% % 84  P 79  81   DLCO corrected ml/min/mmHg 18.77  P 17.67  18.86   DLCO COR %Predicted % 84  P 79  81   DLVA Predicted % 113  P 104  107   TLC L   5.06   TLC % Predicted %   88   RV % Predicted %   73     P Preliminary result        LAB RESULTS last 96 hours No results found.  LAB RESULTS last 90 days Recent Results (from the past 2160 hour(s))  Pulmonary function test     Status: None (Preliminary result)   Collection Time:  07/04/23 10:50 AM  Result Value Ref Range   FVC-Pre 2.61 L   FVC-%Pred-Pre 72 %   FEV1-Pre 2.21 L   FEV1-%Pred-Pre 79 %   FEV6-Pre 2.61 L   FEV6-%Pred-Pre 75 %   Pre FEV1/FVC ratio 85 %   FEV1FVC-%Pred-Pre 109 %   Pre FEV6/FVC Ratio 100 %   FEV6FVC-%Pred-Pre 103 %   FEF 25-75 Pre 2.69 L/sec   FEF2575-%Pred-Pre 115 %   DLCO unc 18.77 ml/min/mmHg   DLCO unc % pred 84 %   DLCO cor 18.77 ml/min/mmHg   DLCO cor % pred 84 %   DL/VA 1.61 ml/min/mmHg/L   DL/VA % pred 096 %         has a past medical history of Anxiety, Arthritis, Cancer (HCC), Depression, and GERD (gastroesophageal reflux disease).   reports that she quit smoking about 7 years ago. Her smoking use included cigarettes. She started smoking about 42 years ago. She has a 35 pack-year smoking history. She has never used smokeless tobacco.  Past Surgical History:  Procedure Laterality Date   ABDOMINAL HYSTERECTOMY     CESAREAN SECTION     x 2   DILATION AND CURETTAGE OF UTERUS     JOINT REPLACEMENT     MENISCUS REPAIR     left  knee   RIGHT HEART CATH N/A 01/27/2023   Procedure: RIGHT HEART CATH;  Surgeon: Yates Decamp, MD;  Location: [facility] INVASIVE CV LAB;  Service: Cardiovascular;  Laterality: N/A;   TOTAL KNEE ARTHROPLASTY Left 10/29/2013   Dr [name]   TOTAL KNEE ARTHROPLASTY Left 10/29/2013   Procedure: LEFT TOTAL KNEE ARTHROPLASTY;  Surgeon: Valeria Batman, MD;  Location: [facility] OR;  Service: Orthopedics;  Laterality: Left;   TOTAL KNEE ARTHROPLASTY Right  04/21/2015   Procedure: TOTAL KNEE ARTHROPLASTY;  Surgeon: Valeria Batman, MD;  Location: [facility] OR;  Service: Orthopedics;  Laterality: Right;   TUBAL LIGATION      No Known Allergies  Immunization History  Administered Date(s) Administered   Influenza,inj,Quad PF,6+ Mos 07/29/2021    Family History  Problem Relation Age of Onset   Hypertension Mother    Heart failure Mother    Heart disease Mother    Other Father        Farming accident   Breast cancer Sister 21   Diabetes Paternal Grandmother    Diabetes Paternal Grandfather      Current Outpatient Medications:    aspirin EC 81 MG tablet, Take 81 mg by mouth daily. Swallow whole., Disp: , Rfl:    atorvastatin (LIPITOR) 40 MG tablet, Take 1 tablet (40 mg total) by mouth daily., Disp: 90 tablet, Rfl: 3   Coenzyme Q10 (COQ-10) 400 MG CAPS, Take 400 mg by mouth daily., Disp: , Rfl:    diclofenac (VOLTAREN) 75 MG EC tablet, Take 75 mg by mouth 2 (two) times daily., Disp: , Rfl:    loratadine (CLARITIN) 10 MG tablet, Take 10 mg by mouth daily., Disp: , Rfl:    LORazepam (ATIVAN) 0.5 MG tablet, Take 0.5 mg by mouth at bedtime., Disp: , Rfl:    Magnesium 400 MG CAPS, Take 400 mg by mouth every evening., Disp: , Rfl:    Multiple Vitamins-Minerals (MULTIVITAMIN WITH MINERALS) tablet, Take 1 tablet by mouth daily., Disp: , Rfl:    sertraline (ZOLOFT) 50 MG tablet, Take 75 mg by mouth daily., Disp: , Rfl:    dextromethorphan-guaiFENesin (MUCINEX DM) 30-600 MG 12hr tablet, Take 2 tablets by mouth 2 (two) times  daily as needed for cough. Taking Mucinex AM/PM twice a day as needed (Patient not taking: Reported on 07/04/2023), Disp: , Rfl:       Objective:   Vitals:   07/04/23 1349  BP: 120/70  Pulse: 79  SpO2: 96%  Weight: 206 lb 3.2 oz (93.5 kg)  Height: 5\' 8"  (1.727 m)    Estimated body mass index is 31.35 kg/m as calculated from the following:   Height as of this encounter: 5\' 8"  (1.727 m).   Weight as of this encounter: 206  lb 3.2 oz (93.5 kg).  @WEIGHTCHANGE @  [employer]   07/04/23 1349  Weight: 206 lb 3.2 oz (93.5 kg)     Physical Exam   General: No distress. Looks well O2 at rest: no Cane present: no Sitting in wheel chair: no Frail: no Obese: yes Neuro: Alert and Oriented x 3. GCS 15. Speech normal Psych: Pleasant Resp:  Barrel Chest - no.  Wheeze - no, Crackles - no, No overt respiratory distress CVS: Normal heart sounds. Murmurs - no Ext: Stigmata of Connective Tissue Disease - no HEENT: Normal upper airway. PEERL +. No post nasal drip        Assessment:       ICD-10-CM   1. ILD (interstitial lung disease) (HCC)  J84.9     2. Stopped smoking with greater than 30 pack year history  Z87.891     3. History of 2019 novel coronavirus disease (COVID-19)  Z86.16     4. 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    SIGNATURE    Dr. Kalman Shan, M.D., F.C.C.P,  Pulmonary and Critical Care Medicine Staff Physician,  [facility] Director - Interstitial Lung Disease  Program  Pulmonary Fibrosis [facility] [place], [place], [address]  Pager: [phone], If no answer or between  15:00h - 7:00h: call [phone] Telephone: [phone]  2:25 PM 07/04/2023   Moderate Complexity MDM OFFICE  2021 E/M guidelines, first released in 2021, with minor revisions added in 2023 and 2024 Must meet the requirements for 2 out of 3 dimensions to qualify.    Number and complexity of problems addressed Amount and/or complexity of data reviewed Risk of complications and/or morbidity  One or more  chronic illness with mild exacerbation, OR progression, OR  side effects of treatment  Two or more stable chronic illnesses  One undiagnosed new problem with uncertain prognosis  One acute illness with systemic symptoms   One Acute complicated injury Must meet the requirements for 1 of 3 of the categories)  Category 1: Tests and documents, historian  Any combination of 3 of the following:  Assessment requiring an independent historian  Review of prior external note(s) from each unique source  Review of results of each unique test  Ordering of each unique test    Category 2: Interpretation of tests   Independent interpretation of a test performed by another physician/other qualified health care professional (not separately reported)  Category 3: Discuss management/tests  Discussion of management or test interpretation with external physician/other qualified health care professional/appropriate source (not separately reported) Moderate risk of morbidity from additional diagnostic testing or treatment Examples only:  Prescription drug management  Decision regarding minor surgery with identfied patient or procedure risk factors  Decision regarding elective major surgery without identified patient or procedure risk factors  Diagnosis or treatment  significantly limited by social determinants of health

## 2023-07-04 NOTE — Patient Instructions (Signed)
Spiro/DLCO preformed today

## 2023-07-04 NOTE — Addendum Note (Signed)
Addended by: Hedda Slade on: 07/04/2023 02:33 PM   Modules accepted: Orders

## 2023-07-04 NOTE — Progress Notes (Signed)
Spiro/DLCO preformed today

## 2023-09-27 ENCOUNTER — Ambulatory Visit
Admission: RE | Admit: 2023-09-27 | Discharge: 2023-09-27 | Disposition: A | Discharge status: Home / Self Care | Payer: BC Managed Care – PPO | Source: Ambulatory Visit | Attending: Family Medicine | Admitting: Family Medicine

## 2023-09-27 ENCOUNTER — Ambulatory Visit
Admission: RE | Admit: 2023-09-27 | Discharge: 2023-09-27 | Disposition: A | Discharge status: Home / Self Care | Payer: Medicare Other | Source: Ambulatory Visit | Attending: Family Medicine | Admitting: Family Medicine

## 2023-09-27 DIAGNOSIS — M8580 Other specified disorders of bone density and structure, unspecified site: Secondary | ICD-10-CM

## 2023-12-04 ENCOUNTER — Ambulatory Visit (HOSPITAL_BASED_OUTPATIENT_CLINIC_OR_DEPARTMENT_OTHER): Payer: Medicare Other

## 2023-12-16 ENCOUNTER — Ambulatory Visit (HOSPITAL_BASED_OUTPATIENT_CLINIC_OR_DEPARTMENT_OTHER): Payer: Medicare Other

## 2023-12-21 ENCOUNTER — Ambulatory Visit (HOSPITAL_BASED_OUTPATIENT_CLINIC_OR_DEPARTMENT_OTHER)
Admission: RE | Admit: 2023-12-21 | Discharge: 2023-12-21 | Disposition: A | Discharge status: Home / Self Care | Payer: Medicare Other | Source: Ambulatory Visit | Attending: Internal Medicine | Admitting: Internal Medicine

## 2023-12-21 ENCOUNTER — Encounter (HOSPITAL_BASED_OUTPATIENT_CLINIC_OR_DEPARTMENT_OTHER): Payer: Self-pay

## 2023-12-21 DIAGNOSIS — J849 Interstitial pulmonary disease, unspecified: Secondary | ICD-10-CM | POA: Insufficient documentation

## 2024-02-20 ENCOUNTER — Encounter: Payer: Self-pay | Admitting: Internal Medicine

## 2024-03-13 ENCOUNTER — Telehealth: Payer: Self-pay | Admitting: Internal Medicine

## 2024-03-13 NOTE — Telephone Encounter (Signed)
 Fax received from Dr. Tracy Friedlander with [facility] to perform a right total hip arthroplasty on patient.  Patient needs surgery clearance. Surgery is pending. Patient was seen on 07/04/23. Office protocol is a risk assessment can be sent to surgeon if patient has been seen in 60 days or less.   Pt has appt for PFT and to see MR on 05/14/24- routing to clearance pool for f/u

## 2024-03-14 ENCOUNTER — Ambulatory Visit (INDEPENDENT_AMBULATORY_CARE_PROVIDER_SITE_OTHER): Admitting: Internal Medicine

## 2024-03-14 ENCOUNTER — Encounter: Payer: Self-pay | Admitting: Internal Medicine

## 2024-03-14 VITALS — BP 145/86 | HR 69 | Ht 68.0 in | Wt 208.4 lb

## 2024-03-14 DIAGNOSIS — J849 Interstitial pulmonary disease, unspecified: Secondary | ICD-10-CM

## 2024-03-14 DIAGNOSIS — Z87891 Personal history of nicotine dependence: Secondary | ICD-10-CM

## 2024-03-14 DIAGNOSIS — Z01811 Encounter for preprocedural respiratory examination: Secondary | ICD-10-CM

## 2024-03-14 NOTE — Telephone Encounter (Signed)
 Patient came in today asking if we have received papers for her to get the surgery and I told her we have received the fax but we are waiting for Ramaswamy to sign the papers. If he could get those papers signed soon that would be great.

## 2024-03-14 NOTE — Telephone Encounter (Signed)
 I called and spoke with the pt She does not want to wait until June to be cleared for surgery  She states needs this surgery immediately  I scheduled her visit with MR since he had opening this afternoon  Routing back to clearance pool until visit complete

## 2024-03-14 NOTE — Patient Instructions (Addendum)
    ICD-10-CM   1. Preoperative respiratory examination  Z01.811     2. ILD (interstitial lung disease) (HCC)  J84.9     3. 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

## 2024-03-14 NOTE — Progress Notes (Signed)
 OV 07/22/2022  Subjective:  Patient ID: Rebecca Huff, female , DOB: 1956-02-13 , age 68 y.o. , MRN: [id number] , ADDRESS: [address] [place] [address] PCP Laurann Montana, MD Patient Care Team: Laurann Montana, MD as PCP - General (Family Medicine)  This Provider for this visit: Treatment Team:  Attending Provider: Kalman Shan, MD    07/22/2022 -   Chief Complaint  Patient presents with   Consult    Pt had a recent CT performed which is the reason for today's visit. Pt states she has an occasional tickle in her throat and states the only time she has any SOB is when she goes upstairs.     HPI Rebecca Huff 68 y.o. -referred by Dr. Severiano [facility].  Patient has been getting annual CT scans low-dose CT scan of the chest for lung cancer screening since 2018.  She reports that the most recent one in May 2023 was reported as abnormal and therefore she had a high-resolution CT chest and has been referred here.  However my personal visualization I believe there was onset of potential ILD versus bibasilar atelectasis in April 2022.  In November 2022 she suffered COVID-19.  She states the symptoms were quite severe and she was coughing for 3 weeks.  She did take antiviral.  She was not hospitalized.  Then after that she started developing Hartness of breath particular for the last 6 to 8 months.  She did have a CT scan of the chest in May 2023 low-dose where in my personal visualization of the ILD changes in the lung base look more prominent.  This was followed by high-resolution CT chest that I personally visualized.  Only supine images.  Is not prone.  She is overweight and has gained 7 pounds in the last few years.  Again shows more prominent ILD changes versus bibasal atelectasis.  Definitely she is got shortness of breath in the last 6-8 months but she feels it is stable and is only mild and it is first-aid case.   [facility] Comprehensive  ILD Questionnaire  Symptoms:   Past Medical History :  -Has osteoarthritis.  Shoulder pain and neck pain -No collagen vascular disease - Did have outpatient COVID in November 2022 - No kidney disease no heart disease no lung disease otherwise.  No blood clots no Lucy   ROS:  -Nonspecific joint pain with arthralgia consistent with osteoarthritis - Has history of snoring for the last few decades  FAMILY HISTORY of LUNG DISEASE:  Denies*  PERSONAL EXPOSURE HISTORY:  Smoke cigarettes 50 1977 in 20 1720 cigarettes/day and quit.  No marijuana use no cocaine use no intravenous drug use.  HOME  EXPOSURE and HOBBY DETAILS :  [place]  Has been living there for 6 years.  Detail organic antigen exposure history in the house is negative.  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 Concern for interstitial lung  disease on recent screening chest CT.   EXAM: CT CHEST WITHOUT CONTRAST   TECHNIQUE: Multidetector CT imaging of the chest was performed following the standard protocol without intravenous contrast. High resolution imaging of the lungs, as well as inspiratory and expiratory imaging, was performed.   RADIATION DOSE REDUCTION: This exam was performed according to the departmental dose-optimization program which includes automated exposure control, adjustment of the mA and/or kV according to patient size and/or use of iterative reconstruction  technique.   COMPARISON:  04/04/2022 screening chest CT.   FINDINGS: Cardiovascular: Normal heart size. No significant pericardial effusion/thickening. Atherosclerotic nonaneurysmal thoracic aorta. Normal caliber pulmonary arteries.   Mediastinum/Nodes: Hypodense 1.3 cm right thyroid nodule is unchanged. Not clinically significant; no follow-up imaging recommended (ref: J Am Coll Radiol. 2015 Feb;12(2): 143-50). Unremarkable esophagus. No pathologically enlarged axillary, mediastinal or hilar lymph nodes, noting limited sensitivity for the detection of hilar adenopathy on this noncontrast study.   Lungs/Pleura: No pneumothorax. No pleural effusion. No acute consolidative airspace disease, lung masses or significant pulmonary nodules. Calcified 4 mm granuloma in the basilar right lower lobe is unchanged. Very mild patchy air trapping in both lungs on the expiration sequence without definite evidence of tracheobronchomalacia. Mild-to-moderate patchy confluent subpleural reticulation and ground-glass opacity in both lungs with a strong posterior lower lobe predominance. No significant regions of traction bronchiectasis, architectural distortion or frank honeycombing. Findings have increased since the baseline 05/08/2017 screening chest CT study and appear mildly increased since 02/27/2021 chest CT.   Upper abdomen: Cholelithiasis.   Musculoskeletal: No aggressive appearing focal osseous lesions. Mild thoracic spondylosis.   IMPRESSION: 1. Mild-to-moderate patchy confluent subpleural reticulation and ground-glass opacity in both lungs with a strong posterior lower lobe predominance. No significant traction bronchiectasis or frank honeycombing. Findings have increased since the baseline 2018 screening chest CT study and appear mildly increased since 02/27/2021 chest CT. Very mild patchy air trapping in both lungs, indicative of small airways disease. Findings are  considered indeterminate for interstitial lung disease such as NSIP or UIP versus a combination of hypoventilatory change and nonspecific bland postinfectious/postinflammatory scarring. Follow-up high-resolution chest CT in 12 months with prone imaging may be considered. Findings are indeterminate for UIP per consensus guidelines: Diagnosis of Idiopathic Pulmonary Fibrosis: An Official ATS/ERS/JRS/ALAT Clinical Practice Guideline. Am Rosezetta Schlatter Crit Care Med Vol 198, Iss 5, [phone], Jul 29 2017. 2. Cholelithiasis. 3. Aortic Atherosclerosis (ICD10-I70.0).     Electronically Signed   By: Delbert Phenix M.D.   On: 06/09/2022 09:36         09/07/2022: Today - follow up Patient presents today for follow-up to discuss pulmonary function testing and high-resolution CT scan results.  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  Her pulmonary function testing was overall normal with no formal restrictive or obstructive defect.  She reports that she has been stable since we saw her last.  No increased shortness of breath from her baseline.  She really only gets winded when she gets in a hurry and is doing multiple different things at 1 time.  Otherwise, she does not really notice any any negative impact on her breathing.  She walks for exercise on a regular basis.  Denies any significant cough, chest congestion, wheezing.  She did have some findings of enlarged pulmonary artery on her CT scan.  DLCO was normal and pulmonary function testing.  She denies any lower extremity swelling, PND or orthopnea. She tells me that she does have some trouble with sleeping at night.  After  she was told that she had drops in her oxygen, she stopped using her Xanax and gabapentin.  She wakes frequently throughout the night.  She also feels like she does not sleep as soundly.  She has been told in the past that she snores.  Wakes up in the morning feeling poorly rested and has daytime fatigue symptoms.  She also has morning  headaches.  She denies any witnessed apneas, sleep parasomnia/paralysis, or history of narcolepsy or cataplexy.  She is wearing 1 L supplemental O2 at night; does not like wearing it.  Wants to know if she can come off of it.  TEST/EVENTS:  07/28/2022: Negative autoimmune panel 08/03/2022 HRCT chest: Mild pulmonary parenchymal pattern of ILD, categorized as probable UIP.  Enlarged right and left pulmonary arteries, consistent with pulmonary arterial hypertension. 08/08/2022 ONO 23 min spent <88%; average 91.6% and SpO2 low 81% 09/06/2022 PFTs: FVC 75, FEV1 80, ratio 87, TLC 88, DLCOcor 81  OV 09/15/2022  Subjective:  Patient ID: Rebecca Huff, female , DOB: [birth year] , age 40 y.o. , MRN: [id number] , ADDRESS: [address] [place] [address] PCP Victorio Grave, MD Patient Care Team: Victorio Grave, MD as PCP - General (Family Medicine)  This Provider for this visit: Treatment Team:  Attending Provider: Maire Scot, MD    09/15/2022 -   Chief Complaint  Patient presents with   Follow-up    Discuss PFT from 09/06/22. Snoring improved.   ILD -subtle since 2017/2018 through 2023 with normal pulmonary function testing except for FVC reduction based on obesity.  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   Former smoker   -No description of emphysema on CT chest but on empiric Spiriva.  Arctic valve calcification on CT scan of the chest 2023 with enlarged pulmonary artery  HPI Iowa B Duskin 68 y.o. -turns for follow-up.  She presents with her husband.  I am meeting him for the first time.  She tells me that she is doing well.  Nurse practitioner gave her empiric Spiriva.  She is not sure if it is helping her.  She thinks it might be helping her.  Symptom score seems somewhat better.  She is willing to stop it.  She is using  nighttime oxygen but its not helping its actually making her tired.  She wants to stop it.   She is here to discuss findings of the CT scan of the chest and work-up so far.  We visualized the CT scans all the way from 2018 through 2023.  In my personal professional opinion I do not think there is progression.  However the radiologist thinks there is progression.  I do agree with the findings probable UIP although it could easily be indeterminate for UIP.  She does have some crackles at the base.  Given age greater than 60 and crackles in definite ILD findings with subpleural reticulation there is some 20% chance of progression over the next few to several years.  If it is probable UIP then per ATS we can call this is IPF and be more confident about progression.  If it is indeterminate then she will require about lung biopsy.  I discussed these possibilities with her but she is somewhat reluctant to have biopsy at this point.  Also she is not too enthusiastic about starting antifibrotic treatment  given the side effect profile and the fact that she is feeling well right now.  Did discuss about the unpredictable nature of ILD's in the future risk of progression although it is reassuring that so far in the last 4 to 5 years even if there is progression her DLCO still normal.  We resolved that we will discuss this in the case conference and then make a decision.  In addition the CT scan of the chest shows aortic valve calcification and enlarged pulmonary arteries she has not had an echocardiogram before.  She has agreed to get this.     CT Chest data - HRCT 08/13/22  Narrative & Impression  CLINICAL DATA:  Interstitial lung disease. Only prone imaging requested. Shortness of breath with exertion.   EXAM: CT CHEST WITHOUT CONTRAST   TECHNIQUE: Multidetector CT imaging of the chest was performed following the standard protocol without intravenous contrast. High resolution imaging of the lungs, as well  as inspiratory and expiratory imaging, was performed.   RADIATION DOSE REDUCTION: This exam was performed according to the departmental dose-optimization program which includes automated exposure control, adjustment of the mA and/or kV according to patient size and/or use of iterative reconstruction technique.   COMPARISON:  06/07/2022, 04/04/2022, 02/27/2021.   FINDINGS: Cardiovascular: Atherosclerotic calcification of the aorta and aortic valve. Enlarged right and left pulmonary arteries. Heart size at the upper limits of normal. No pericardial or pleural effusion. Distal esophagus is unremarkable.   Mediastinum/Nodes: No pathologically enlarged mediastinal or axillary lymph nodes. Hilar regions are difficult to definitively evaluate without IV contrast. Esophagus is grossly unremarkable.   Lungs/Pleura: Mild peripheral and basilar predominant subpleural reticulation, ground-glass and traction bronchiectasis/bronchiolectasis. Findings were present on prior exams but 1:1 comparison is challenging due to prone imaging on today's study. No pleural fluid. Mild nodularity along the ventral and lateral walls of the trachea can be seen with tracheobronchopathia osteochondroplastica. No air trapping.   Upper Abdomen: Visualized portion of the liver is unremarkable. Stones in the gallbladder. Visualized portions of the adrenal glands, left kidney, spleen, pancreas, stomach and bowel are grossly unremarkable.   Musculoskeletal: Degenerative changes in the spine. No worrisome lytic or sclerotic lesions.   IMPRESSION: 1. 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 Findings are categorized as probable UIP per consensus guidelines: Diagnosis of Idiopathic Pulmonary Fibrosis: An Official ATS/ERS/JRS/ALAT Clinical Practice Guideline. Am Annie Barton Crit Care Med Vol 198, Iss 5, [phone], Jul 29 2017. 2. Cholelithiasis. 3.   Aortic atherosclerosis (ICD10-I70.0). 4. Enlarged right and left pulmonary arteries, indicative of pulmonary arterial hypertension.     Electronically Signed   By: Shearon Denis M.D.   On: 08/15/2022 14:04          OV 11/10/2022  Subjective:  Patient ID: Rebecca Huff, female , DOB: 06-Dec-1955 , age 65 y.o. , MRN: [id number] , ADDRESS: [address] [place] [address] PCP Victorio Grave, MD Patient Care Team: Victorio Grave, MD as PCP - General (Family Medicine)  This Provider for this visit: Treatment Team:  Attending Provider: Maire Scot, MD  11/10/2022 -  ILD workup in [place]   Type of visit: Video Virtual Visit Identification of patient LIYAT FAULKENBERRY with 09/27/1956 and MRN [id number] - 2 person identifier Risks: Risks, benefits, limitations of telephone visit explained. Patient understood and verbalized agreement to proceed Anyone else on call: ust patient Patient location: [facility] her Kerman Peck is in clinic This provider location: [phone]  [address], [address]; [place]; [place] [address]. [facility]. [phone]     HPI Birda B Shiroma 68 y.o. -presents for follow-up.  Is a video visit to discuss outcome of her case conference.  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  It is early and mild.  There is also agreement that there was progression.  I shared these findings with her.  I said the clinical pretest probably to hear his IPF.  I did indicate to her that if it is IPF it means that she is likely to get worse with time over the next few or several years.  With early disease it might even take 7 to 10 years.  On the other hand she could rapidly decline in just a few years.  I did indicate to her that there are 3 options which includes waiting and watching for development of further progression.  I recommended against this.  The other option is to commit to antifibrotic's.  I  did indicate the general side effect nature and the properties of antifibrotic's.  The other option is to participate in clinical trial.  The other option is to go ahead with a lung biopsy to confirm the diagnosis.  She is choosing between lung biopsy versus antifibrotic/clinical trial.  Overall the conference felt quite confident about probable UIP and per latest ATS criteria this would just become IPF without having to undergo lung biopsy.  Did indicate to her that not urgent but it is important in the next few months we get the sorted out.  Did indicate to her that she needs to protect herself from respiratory infections.   We discussed echo report whether some amount of RV strain.  There is also enlarged pulmonary artery on the CT scan.  There is aortic root dilatation.  She is never seen a cardiologist.  I told her I would refer her to 1.    OV 01/10/2023  Subjective:  Patient ID: Rebecca Huff, female , DOB: 1956/10/05 , age 53 y.o. , MRN: [id number] , AD[address]ESS: [address] [place] [address] PCP Laurann Montana, MD Patient Care Team: Laurann Montana, MD as PCP - General (Family Medicine) Kalman Shan, MD as Consulting Physician (Pulmonary Disease)  This Provider for this visit: Treatment Team:  Attending Provider: Kalman Shan, MD    01/10/2023 -   Chief Complaint  Patient presents with   Follow-up    Sinus congestion and cough x 1 week.  Did see Dr. Jacinto Halim. Had stress test.  Review PFT from 11/09/23.   HPI RASHA IBE 68 y.o. -returns with her husband.  He is an independent historian.  She feels stable.  Symptom score below shows stability.  In the interim she has seen Dr. Jacinto Halim for her aortic valve calcification and enlarged pulmonary artery.  In addition review of the records indicate he was diagnosed with a 4 cm ascending aortic aneurysm.  He is contemplating a nuclear stress test and then deciding whether she needs left versus right heart catheterization or  just right heart catheterization only.  She has follow-up visit with him pending.  In terms of ILD she continues to be stable.  We again visualized the images.  My personal professional opinion the onset of ILD was somewhere in 2020.  In 2017 and 2018 she might not have had ILD.  And since 2028 tad worse perhaps.  Radiology conference definitely felt it was worse.  Still her pulmonary function test today is still normal and in the exam if she has crackles then that she has very minimal crackles on the right base.  She and I and her husband we discussed options in a care which include continued monitoring versus antifibrotic therapy initiation right now.  We discussed the side effects of antifibrotic therapy though the reversible are unpleasant and can be unpleasant occasionally or periodically.  Some of the data indicate that after a few years more than 50% of patients give up on these medications.  At the same time that the patient is tolerating these medications for many years.  We discussed about getting biopsy and the 2 biopsy methods including bronchoscopy without any genomic classifier.  This is the limitations of this.  Discussed surgical lung biopsy.  Did indicate that the reason for the biopsy would be to diagnose UIP and UIP would be a negative prognostic sign and rationale for biopsy would be intention to treat early with antifibrotic's.  She did indicate that the presence of these findings do make her anxious.  Her husband prefers a slightly more conservative approach.  We resolved that we would reassess again in a few months with pulmonary function test.  This would give her time to finish her cardiac workup.  Did indicate that my bias would be to at least get a bronchoscopy with lavage and transbronchial biopsies for genomic classifier done.  Along with a MDD this would increase the sensitivity and specificity in diagnosing UIP/IPF.  And the risk would be much less compared to surgical lung biopsy.   She is understanding and accepting of this approach.  Of note for the last week or so she has had sinus complaints.  She thinks it is because of the weather no sick contacts.  There is occasional yellow drainage but mostly clear.  She feels congested.  In the interim no other medical issues.  She feels she might benefit from antibiotic and prednisone course.   OV 07/04/2023  Subjective:  Patient ID: Rebecca Huff, female , DOB: Mar 08, 1956 , age 72 y.o. , MRN: [id number] , AD[address]ESS: [address] [place] [address] PCP Victorio Grave, MD Patient Care Team: Victorio Grave, MD as PCP - General (Family Medicine) Maire Scot, MD as Consulting Physician (Pulmonary Disease)  This Provider for this visit: Treatment Team:  Attending Provider: Maire Scot, MD     07/04/2023 -   Chief Complaint  Patient presents with   Follow-up    F/up on PFT, no complaints     HPI Kaylamarie B Morikawa 68 y.o. -presents for follow-up of very mild ILD.  She states that since her last visit she has had some respiratory exacerbations and then she had cough with clearing of the throat.  She did require some antibiotics.  But currently she is maintained both H2 blockade and PPI and with this the cough and the clearing of the throat improved significantly.  Other than this Interim Health status: No new complaints No new medical problems. No new surgeries. No ER visits. No Urgent care visits. No changes to medications. At this point in time she prefers continued follow-up.  Not interested in biopsy.  She not interested in antifibrotic's.  She did indicate to me that she would be interested in getting a CT scan and follow-up.  Review of the charts indicate that she did have a significant smoking history.  1 year CT scan time would be September 2024 because the last one  in September 2023.  She is agreed to have a high-resolution CT chest within the next 6 months when she comes back for  follow-up.        OV 03/14/2024  Subjective:  Patient ID: Rebecca Huff, female , DOB: 10/15/1956 , age 80 y.o. , MRN: [id number] , AD[address]ESS: [address] [place] [address] PCP Laurann Montana, MD Patient Care Team: Laurann Montana, MD as PCP - General (Family Medicine) Kalman Shan, MD as Consulting Physician (Pulmonary Disease)  This Provider for this visit: Treatment Team:  Attending Provider: Kalman Shan, MD    03/14/2024 -   Chief Complaint  Patient presents with   Follow-up    Surgical clearance    ILD -subtle since 2017/2018 through 2023 with normal pulmonary function testing except for FVC reduction based on obesity.  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  Former smoker   -No description of emphysema on CT chest but on empiric Spiriva.  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  - Pulmonary artery enlargement not reported in CT chest January 2025   - Normal right heart catheterization March 2024  - Normal echo November 2023  HPI Rebecca Huff 68 y.o. -returns for follow-up sooner than expected.  Visit was scheduled for mid June 2025.  This is an acute visit but for preoperative clearance which is a new issue.  She states since July 2024 she has had moderate to severe right hip pain.  She did not mention this significantly in August 2024 visit but over the winter it got worse.  Walking from our office to the parking lot was okay but anything past that causes significant hip pain.  She has been  getting some back injections as well.  Beyond this no new medical problems no hospitalizations no ER visits and surgery.  She feels shortness of breath is the same.  She had high-resolution CT chest in January 2025.  It shows possible mild progression in her ILD.  But she is acutely in need of hip surgery.  [place] and she wanted a clearance.  Her exercise hypoxemia test and her symptom score show that the ILD is stable.     SYMPTOM SCALE - ILD 07/22/2022 09/15/2022 spiriva 01/11/2023 Supporitve care 07/04/2023    Current weight       O2 use ra ra ra ra   Shortness of Breath 0 -> 5 scale with 5 being worst (score 6 If unable to do)      At rest 0 0 0 0   Simple tasks - showers, clothes change, eating, shaving 0 0 0 0   Household (dishes, doing bed, laundry) 1 0 1 1   Shopping 0 0 0 0   Walking level at own pace 0 0 0 0   Walking up Stairs 2 1 2 1    Total (30-36) Dyspnea Score 3 1 3 2        Non-dyspnea symptoms (0-> 5 scale) 07/22/2022 09/15/2022  01/11/2023  07/04/2023  How bad is your cough? 0 0 1 0   How bad is your fatigue 000 0 2 0   How bad is nausea 0 0 0 0   How bad is vomiting?  00 0 0 0   How bad is diarrhea? 0 0 0 0   How bad is anxiety? 0 0 1 0   How bad is depression 0 2 1 0   Any chronic pain - if so where and how bad 0 0 x 0        SIT STAND TEST - goal 15 times   03/14/2024    O2 used ra   PRobe - finter or forehead fingre   Number sit and stand completed - goal 15 Yes x 15   Time taken to complete 57 seco slow   Resting Pulse Ox/HR/Dyspnea  98% and 70/min and dyspnea of 1/10    Peak measures 99 % and 100/min and dyspnea of 3/10   Final Pulse Ox/HR 97% and 81/min and dyspnea of 1/10   Desaturated </= 88% no   Desaturated <= 3% points no   Got Tachycardic >/= 90/min yes   Miscellaneous comments Mild dyspnea       LINICAL DATA:  68 year old female with history of interstitial lung disease. Follow-up study.    EXAM: CT CHEST WITHOUT CONTRAST   TECHNIQUE: Multidetector CT imaging of the chest was performed following the standard protocol without intravenous contrast. High resolution imaging of the lungs, as well as inspiratory and expiratory imaging, was performed.   RADIATION DOSE REDUCTION: This exam was performed according to the departmental dose-optimization program which includes automated exposure control, adjustment of the mA and/or kV according to patient size and/or use of iterative reconstruction technique.   COMPARISON:  High-resolution chest CT 08/13/2022.   FINDINGS: Cardiovascular: Heart size is normal. There is no significant pericardial fluid, thickening or pericardial calcification. There is aortic atherosclerosis, as well as atherosclerosis of the great vessels of the mediastinum and the coronary arteries, including calcified atherosclerotic plaque in the right coronary artery.   Mediastinum/Nodes: No pathologically enlarged mediastinal or hilar lymph nodes. Please note that accurate exclusion of hilar adenopathy is limited on noncontrast CT scans. Esophagus is unremarkable in appearance. No axillary lymphadenopathy.   Lungs/Pleura: High-resolution imaging again demonstrates mild peripheral predominant areas of ground-glass attenuation, septal thickening, subpleural reticulation, mild cylindrical bronchiectasis and peripheral bronchiolectasis. These findings have a definitive craniocaudal gradient and are minimally progressive compared to the prior examination. No definite bronchiectasis. No acute consolidative airspace disease. No pleural effusions. No definite suspicious appearing pulmonary nodules or masses noted.   Upper Abdomen: Aortic atherosclerosis. Diffuse low attenuation throughout the visualized hepatic parenchyma, indicative of a background of hepatic steatosis.   Musculoskeletal: There are no aggressive appearing lytic or blastic lesions noted in  the visualized portions of the skeleton.   IMPRESSION: 1. The appearance of the lungs is again compatible with interstitial lung disease, with a spectrum of findings considered probable usual interstitial pneumonia (UIP) per current ATS guidelines. Findings are minimally progressive compared to the prior study. 2. Aortic atherosclerosis, in addition to right coronary artery disease. 3. Hepatic steatosis.   Aortic Atherosclerosis (ICD10-I70.0).     Electronically Signed   By: Trudie Reed M.D.   On: 12/30/2023 11:17   PFT     Latest Ref Rng & Units 07/04/2023   10:50 AM 01/09/2023   10:17 AM 09/06/2022    9:04 AM  PFT Results  FVC-Pre L 2.61  2.90  2.82   FVC-Predicted Pre % 72  79  75   FVC-Post L   2.87   FVC-Predicted Post %   76   Pre FEV1/FVC % % 85  82  82   Post FEV1/FCV % %   87   FEV1-Pre L 2.21  2.38  2.33   FEV1-Predicted Pre % 79  85  80   FEV1-Post L   2.48   DLCO uncorrected ml/min/mmHg 18.77  17.67  18.86   DLCO UNC% % 84  79  81   DLCO corrected ml/min/mmHg 18.77  17.67  18.86   DLCO COR %Predicted % 84  79  81   DLVA Predicted % 113  104  107   TLC L   5.06   TLC % Predicted %   88   RV % Predicted %   73        LAB RESULTS last 96 hours No results found.       has a past medical history of Anxiety, Arthritis, Cancer (HCC), Depression, and GERD (gastroesophageal reflux disease).   reports that she quit smoking about 8 years ago. Her smoking use included cigarettes. She started smoking about 43 years ago. She has a 35 pack-year smoking history. She has never used smokeless tobacco.  Past Surgical History:  Procedure Laterality Date   ABDOMINAL HYSTERECTOMY     CESAREAN SECTION     x 2   DILATION AND CURETTAGE OF UTERUS     JOINT REPLACEMENT     MENISCUS REPAIR     left  knee   RIGHT HEART CATH N/A 01/27/2023   Procedure: RIGHT HEART CATH;  Surgeon: Knox Perl, MD;  Location: [facility] INVASIVE CV LAB;  Service: Cardiovascular;  Laterality:  N/A;   TOTAL KNEE ARTHROPLASTY Left 10/29/2013   Dr Aviva Lemmings   TOTAL KNEE ARTHROPLASTY Left 10/29/2013   Procedure: LEFT TOTAL KNEE ARTHROPLASTY;  Surgeon: Shirlee Dotter, MD;  Location: [facility] OR;  Service: Orthopedics;  Laterality: Left;   TOTAL KNEE ARTHROPLASTY Right 04/21/2015   Procedure: TOTAL KNEE ARTHROPLASTY;  Surgeon: Shirlee Dotter, MD;  Location: [facility] OR;  Service: Orthopedics;  Laterality: Right;   TUBAL LIGATION      No Known Allergies  Immunization History  Administered Date(s) Administered   Influenza,inj,Quad PF,6+ Mos 07/29/2021    Family History  Problem Relation Age of Onset   Hypertension Mother    Heart failure Mother    Heart disease Mother    Other Father        Farming accident   Breast cancer Sister 64   Diabetes Paternal Grandmother    Diabetes Paternal Grandfather      Current Outpatient Medications:    aspirin EC 81 MG tablet, Take 81 mg by mouth daily. Swallow whole., Disp: , Rfl:    atorvastatin (LIPITOR) 40 MG tablet, Take 1 tablet (40 mg total) by mouth daily., Disp: 90 tablet, Rfl: 3   Coenzyme Q10 (COQ-10) 400 MG CAPS, Take 400 mg by mouth daily., Disp: , Rfl:    dextromethorphan-guaiFENesin (MUCINEX DM) 30-600 MG 12hr tablet, Take 2 tablets by mouth 2 (two) times daily as needed for cough. Taking Mucinex AM/PM twice a day as needed, Disp: , Rfl:    famotidine (PEPCID) 20 MG tablet, Take 20 mg by mouth daily., Disp: , Rfl:    [START ON 04/13/2024] HYDROcodone-acetaminophen (NORCO/VICODIN) 5-325 MG tablet, Take 1 tablet by mouth in the morning and at bedtime. As need, Disp: ,  Rfl:    loratadine (CLARITIN) 10 MG tablet, Take 10 mg by mouth daily., Disp: , Rfl:    Magnesium 400 MG CAPS, Take 400 mg by mouth every evening., Disp: , Rfl:    Multiple Vitamins-Minerals (MULTIVITAMIN WITH MINERALS) tablet, Take 1 tablet by mouth daily., Disp: , Rfl:    omeprazole (PRILOSEC) 40 MG capsule, Take 40 mg by mouth every morning., Disp: , Rfl:    sertraline  (ZOLOFT) 50 MG tablet, Take 75 mg by mouth daily., Disp: , Rfl:    [START ON 04/03/2024] traMADol (ULTRAM) 50 MG tablet, Take by mouth every 8 (eight) hours as needed., Disp: , Rfl:    diclofenac (VOLTAREN) 75 MG EC tablet, Take 75 mg by mouth 2 (two) times daily. (Patient not taking: Reported on 03/14/2024), Disp: , Rfl:    LORazepam (ATIVAN) 0.5 MG tablet, Take 0.5 mg by mouth at bedtime. (Patient not taking: Reported on 03/14/2024), Disp: , Rfl:       Objective:   Vitals:   03/14/24 1506  BP: (!) 145/86  Pulse: 69  SpO2: 97%  Weight: 208 lb 6.4 oz (94.5 kg)  Height: 5\' 8"  (1.727 m)    Estimated body mass index is 31.69 kg/m as calculated from the following:   Height as of this encounter: 5\' 8"  (1.727 m).   Weight as of this encounter: 208 lb 6.4 oz (94.5 kg).  @WEIGHTCHANGE @  [employer]   03/14/24 1506  Weight: 208 lb 6.4 oz (94.5 kg)     Physical Exam   General: No distress. No distress O2 at rest: no Cane present: no Sitting in wheel chair: no Frail: no Obese: no Neuro: Alert and Oriented x 3. GCS 15. Speech normal Psych: Pleasant Resp:  Barrel Chest - no.  Wheeze - no, Crackles - yes mild bse, No overt respiratory distress CVS: Normal heart sounds. Murmurs - no Ext: Stigmata of Connective Tissue Disease - no HEENT: Normal upper airway. PEERL +. No post nasal drip        Assessment:       ICD-10-CM   1. Preoperative respiratory examination  Z01.811 Pulmonary function test    2. ILD (interstitial lung disease) (HCC)  J84.9 Pulmonary function test    3. Stopped smoking with greater than 30 pack year history  Z87.891 Pulmonary function test        1) RISK FOR PROLONGED MECHANICAL VENTILAION - > 48h  1A) Arozullah - Prolonged mech ventilation risk Arozullah Postperative Pulmonary Risk Score - for mech ventilation dependence >48h [employer], Ann Surg 2000, major non-cardiac surgery) Comment Score  Type of surgery - abd ao aneurysm (27),  thoracic (21), neurosurgery / upper abdominal / vascular (21), neck (11) HIP surger 0  Emergency Surgery - (11) elective 0  ALbumin < 3 or poor nutritional state - (9) Normal nutrtion 0  BUN > 30 -  (8) Bun normal 0  Partial or completely dependent functional status - (7) independet 0  COPD -  (6) ILD 6  Age - 60 to 62 (4), > 70  (6) Age 76 4  TOTAL  10  Risk Stratifcation scores  - < 10 (0.5%), 11-19 (1.8%), 20-27 (4.2%), 28-40 (10.1%), >40 (26.6%)  LOW RISK       Plan:     Patient Instructions     ICD-10-CM   1. Preoperative respiratory examination  Z01.811     2. ILD (interstitial lung disease) (HCC)  J84.9     3. 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 , progression , Prob UIP  - CTs to January 2025 with possible progression but if so very mild progression  Plan = shared deciision making - no antifibrotic this visit  - do spirometry and dlco in 3-4 months  - Cancel June 2025 office visit and PFT - no biopsy at this time - at followup based on PFT  - if progression: start anti-fibrotic (Esbriet or Ofev)   - if stable: options are continued monitoring v biopsy (envisia  v surgical)    Followup Cancel June 2025 office visit and PFT 3-4  months - 30 minute but after PFT     FOLLOWUP Return in about 3 months (around 06/13/2024) for [facility] June 2023 office visit and PFT but return in 3 to 4 months after the same, 30 min visit.    SIGNATURE    Dr. Maire Scot, M.D., F.C.C.P,  Pulmonary and Critical Care Medicine Staff Physician, [facility] Center Director - Interstitial Lung Disease  Program  Pulmonary Fibrosis [facility] at [facility] [place], [place], [address]  Pager: [phone], If no answer or between  15:00h - 7:00h: call [phone] Telephone: [phone]  3:41 PM 03/14/2024

## 2024-03-28 NOTE — Telephone Encounter (Signed)
 Copy of 03/14/24 ov note with risk assessment faxed to [facility]

## 2024-04-07 ENCOUNTER — Other Ambulatory Visit: Payer: Self-pay | Admitting: Cardiology

## 2024-04-07 DIAGNOSIS — E7800 Pure hypercholesterolemia, unspecified: Secondary | ICD-10-CM

## 2024-05-14 ENCOUNTER — Ambulatory Visit: Payer: PRIVATE HEALTH INSURANCE | Admitting: Internal Medicine

## 2024-06-05 NOTE — Patient Instructions (Signed)
    ICD-10-CM   1. Preoperative respiratory examination  Z01.811     2. ILD (interstitial lung disease) (HCC)  J84.9     3. 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

## 2024-06-05 NOTE — Progress Notes (Unsigned)
 OV 07/22/2022  Subjective:  Patient ID: Rebecca Huff, female , DOB: [birth year] , age 68 y.o. , MRN: [id number] , [address]: [address] [place] [address] PCP Teresa Channel, MD Patient Care Team: Teresa Channel, MD as PCP - General (Family Medicine)  This Provider for this visit: Treatment Team:  Attending Provider: Geronimo Amel, MD    07/22/2022 -   Chief Complaint  Patient presents with   Consult    Pt had a recent CT performed which is the reason for today's visit. Pt states she has an occasional tickle in her throat and states the only time she has any SOB is when she goes upstairs.     HPI Rebecca Huff 68 y.o. -referred by Dr. Channel [facility].  Patient has been getting annual CT scans low-dose CT scan of the chest for lung cancer screening since 2018.  She reports that the most recent one in May 2023 was reported as abnormal and therefore she had a high-resolution CT chest and has been referred here.  However my personal visualization I believe there was onset of potential ILD versus bibasilar atelectasis in April 2022.  In November 2022 she suffered COVID-19.  She states the symptoms were quite severe and she was coughing for 3 weeks.  She did take antiviral.  She was not hospitalized.  Then after that she started developing Hartness of breath particular for the last 6 to 8 months.  She did have a CT scan of the chest in May 2023 low-dose where in my personal visualization of the ILD changes in the lung base look more prominent.  This was followed by high-resolution CT chest that I personally visualized.  Only supine images.  Is not prone.  She is overweight and has gained 7 pounds in the last few years.  Again shows more prominent ILD changes versus bibasal atelectasis.  Definitely she is got shortness of breath in the last 6-8 months but she feels it is stable and is only mild and it is first-aid case.   [facility] Comprehensive  ILD Questionnaire  Symptoms:   Past Medical History :  -Has osteoarthritis.  Shoulder pain and neck pain -No collagen vascular disease - Did have outpatient COVID in November 2022 - No kidney disease no heart disease no lung disease otherwise.  No blood clots no Lucy   ROS:  -Nonspecific joint pain with arthralgia consistent with osteoarthritis - Has history of snoring for the last few decades  FAMILY HISTORY of LUNG DISEASE:  Denies*  PERSONAL EXPOSURE HISTORY:  Smoke cigarettes 50 1977 in 20 1720 cigarettes/day and quit.  No marijuana use no cocaine use no intravenous drug use.  HOME  EXPOSURE and HOBBY DETAILS :  [place]  Has been living there for 6 years.  Detail organic antigen exposure history in the house is negative.  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 Concern for interstitial lung  disease on recent screening chest CT.   EXAM: CT CHEST WITHOUT CONTRAST   TECHNIQUE: Multidetector CT imaging of the chest was performed following the standard protocol without intravenous contrast. High resolution imaging of the lungs, as well as inspiratory and expiratory imaging, was performed.   RADIATION DOSE REDUCTION: This exam was performed according to the departmental dose-optimization program which includes automated exposure control, adjustment of the mA and/or kV according to patient size and/or use of iterative reconstruction  technique.   COMPARISON:  04/04/2022 screening chest CT.   FINDINGS: Cardiovascular: Normal heart size. No significant pericardial effusion/thickening. Atherosclerotic nonaneurysmal thoracic aorta. Normal caliber pulmonary arteries.   Mediastinum/Nodes: Hypodense 1.3 cm right thyroid nodule is unchanged. Not clinically significant; no follow-up imaging recommended (ref: J Am Coll Radiol. 2015 Feb;12(2): 143-50). Unremarkable esophagus. No pathologically enlarged axillary, mediastinal or hilar lymph nodes, noting limited sensitivity for the detection of hilar adenopathy on this noncontrast study.   Lungs/Pleura: No pneumothorax. No pleural effusion. No acute consolidative airspace disease, lung masses or significant pulmonary nodules. Calcified 4 mm granuloma in the basilar right lower lobe is unchanged. Very mild patchy air trapping in both lungs on the expiration sequence without definite evidence of tracheobronchomalacia. Mild-to-moderate patchy confluent subpleural reticulation and ground-glass opacity in both lungs with a strong posterior lower lobe predominance. No significant regions of traction bronchiectasis, architectural distortion or frank honeycombing. Findings have increased since the baseline 05/08/2017 screening chest CT study and appear mildly increased since 02/27/2021 chest CT.   Upper abdomen: Cholelithiasis.   Musculoskeletal: No aggressive appearing focal osseous lesions. Mild thoracic spondylosis.   IMPRESSION: 1. Mild-to-moderate patchy confluent subpleural reticulation and ground-glass opacity in both lungs with a strong posterior lower lobe predominance. No significant traction bronchiectasis or frank honeycombing. Findings have increased since the baseline 2018 screening chest CT study and appear mildly increased since 02/27/2021 chest CT. Very mild patchy air trapping in both lungs, indicative of small airways disease. Findings are  considered indeterminate for interstitial lung disease such as NSIP or UIP versus a combination of hypoventilatory change and nonspecific bland postinfectious/postinflammatory scarring. Follow-up high-resolution chest CT in 12 months with prone imaging may be considered. Findings are indeterminate for UIP per consensus guidelines: Diagnosis of Idiopathic Pulmonary Fibrosis: An Official ATS/ERS/JRS/ALAT Clinical Practice Guideline. Am JINNY Honey Crit Care Med Vol 198, Iss 5, [phone], Jul 29 2017. 2. Cholelithiasis. 3. Aortic Atherosclerosis (ICD10-I70.0).     Electronically Signed   By: Selinda DELENA Blue M.D.   On: 06/09/2022 09:36         09/07/2022: Today - follow up Patient presents today for follow-up to discuss pulmonary function testing and high-resolution CT scan results.  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  Her pulmonary function testing was overall normal with no formal restrictive or obstructive defect.  She reports that she has been stable since we saw her last.  No increased shortness of breath from her baseline.  She really only gets winded when she gets in a hurry and is doing multiple different things at 1 time.  Otherwise, she does not really notice any any negative impact on her breathing.  She walks for exercise on a regular basis.  Denies any significant cough, chest congestion, wheezing.  She did have some findings of enlarged pulmonary artery on her CT scan.  DLCO was normal and pulmonary function testing.  She denies any lower extremity swelling, PND or orthopnea. She tells me that she does have some trouble with sleeping at night.  After  she was told that she had drops in her oxygen , she stopped using her Xanax and gabapentin.  She wakes frequently throughout the night.  She also feels like she does not sleep as soundly.  She has been told in the past that she snores.  Wakes up in the morning feeling poorly rested and has daytime fatigue symptoms.  She also has morning  headaches.  She denies any witnessed apneas, sleep parasomnia/paralysis, or history of narcolepsy or cataplexy.  She is wearing 1 L supplemental O2 at night; does not like wearing it.  Wants to know if she can come off of it.  TEST/EVENTS:  07/28/2022: Negative autoimmune panel 08/03/2022 HRCT chest: Mild pulmonary parenchymal pattern of ILD, categorized as probable UIP.  Enlarged right and left pulmonary arteries, consistent with pulmonary arterial hypertension. 08/08/2022 ONO 23 min spent <88%; average 91.6% and SpO2 low 81% 09/06/2022 PFTs: FVC 75, FEV1 80, ratio 87, TLC 88, DLCOcor 81  OV 09/15/2022  Subjective:  Patient ID: Rebecca Huff, female , DOB: 15-Jan-1956 , age 33 y.o. , MRN: [id number] , [address]: [address] [place] [address] PCP Teresa Channel, MD Patient Care Team: Teresa Channel, MD as PCP - General (Family Medicine)  This Provider for this visit: Treatment Team:  Attending Provider: Geronimo Amel, MD    09/15/2022 -   Chief Complaint  Patient presents with   Follow-up    Discuss PFT from 09/06/22. Snoring improved.   ILD -subtle since 2017/2018 through 2023 with normal pulmonary function testing except for FVC reduction based on obesity.  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   Former smoker   -No description of emphysema on CT chest but on empiric Spiriva .  Arctic valve calcification on CT scan of the chest 2023 with enlarged pulmonary artery  HPI Sameera B Thornsberry 68 y.o. -turns for follow-up.  She presents with her husband.  I am meeting him for the first time.  She tells me that she is doing well.  Nurse practitioner gave her empiric Spiriva .  She is not sure if it is helping her.  She thinks it might be helping her.  Symptom score seems somewhat better.  She is willing to stop it.  She is using  nighttime oxygen  but its not helping its actually making her tired.  She wants to stop it.   She is here to discuss findings of the CT scan of the chest and work-up so far.  We visualized the CT scans all the way from 2018 through 2023.  In my personal professional opinion I do not think there is progression.  However the radiologist thinks there is progression.  I do agree with the findings probable UIP although it could easily be indeterminate for UIP.  She does have some crackles at the base.  Given age greater than 60 and crackles in definite ILD findings with subpleural reticulation there is some 20% chance of progression over the next few to several years.  If it is probable UIP then per ATS we can call this is IPF and be more confident about progression.  If it is indeterminate then she will require about lung biopsy.  I discussed these possibilities with her but she is somewhat reluctant to have biopsy at this point.  Also she is not too enthusiastic about starting antifibrotic treatment  given the side effect profile and the fact that she is feeling well right now.  Did discuss about the unpredictable nature of ILD's in the future risk of progression although it is reassuring that so far in the last 4 to 5 years even if there is progression her DLCO still normal.  We resolved that we will discuss this in the case conference and then make a decision.  In addition the CT scan of the chest shows aortic valve calcification and enlarged pulmonary arteries she has not had an echocardiogram before.  She has agreed to get this.     CT Chest data - HRCT 08/13/22  Narrative & Impression  CLINICAL DATA:  Interstitial lung disease. Only prone imaging requested. Shortness of breath with exertion.   EXAM: CT CHEST WITHOUT CONTRAST   TECHNIQUE: Multidetector CT imaging of the chest was performed following the standard protocol without intravenous contrast. High resolution imaging of the lungs, as well  as inspiratory and expiratory imaging, was performed.   RADIATION DOSE REDUCTION: This exam was performed according to the departmental dose-optimization program which includes automated exposure control, adjustment of the mA and/or kV according to patient size and/or use of iterative reconstruction technique.   COMPARISON:  06/07/2022, 04/04/2022, 02/27/2021.   FINDINGS: Cardiovascular: Atherosclerotic calcification of the aorta and aortic valve. Enlarged right and left pulmonary arteries. Heart size at the upper limits of normal. No pericardial or pleural effusion. Distal esophagus is unremarkable.   Mediastinum/Nodes: No pathologically enlarged mediastinal or axillary lymph nodes. Hilar regions are difficult to definitively evaluate without IV contrast. Esophagus is grossly unremarkable.   Lungs/Pleura: Mild peripheral and basilar predominant subpleural reticulation, ground-glass and traction bronchiectasis/bronchiolectasis. Findings were present on prior exams but 1:1 comparison is challenging due to prone imaging on today's study. No pleural fluid. Mild nodularity along the ventral and lateral walls of the trachea can be seen with tracheobronchopathia osteochondroplastica. No air trapping.   Upper Abdomen: Visualized portion of the liver is unremarkable. Stones in the gallbladder. Visualized portions of the adrenal glands, left kidney, spleen, pancreas, stomach and bowel are grossly unremarkable.   Musculoskeletal: Degenerative changes in the spine. No worrisome lytic or sclerotic lesions.   IMPRESSION: 1. 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 Findings are categorized as probable UIP per consensus guidelines: Diagnosis of Idiopathic Pulmonary Fibrosis: An Official ATS/ERS/JRS/ALAT Clinical Practice Guideline. Am JINNY Honey Crit Care Med Vol 198, Iss 5, [phone], Jul 29 2017. 2. Cholelithiasis. 3.   Aortic atherosclerosis (ICD10-I70.0). 4. Enlarged right and left pulmonary arteries, indicative of pulmonary arterial hypertension.     Electronically Signed   By: Newell Eke M.D.   On: 08/15/2022 14:04          OV 11/10/2022  Subjective:  Patient ID: Rebecca Huff, female , DOB: 30-Sep-1956 , age 31 y.o. , MRN: [id number] , [address]: [address] [place] [address] PCP Teresa Channel, MD Patient Care Team: Teresa Channel, MD as PCP - General (Family Medicine)  This Provider for this visit: Treatment Team:  Attending Provider: Geronimo Amel, MD  11/10/2022 -  ILD workup in [place]   Type of visit: Video Virtual Visit Identification of patient KAMARIE VENO with 23-Jul-1956 and MRN [id number] - 2 person identifier Risks: Risks, benefits, limitations of telephone visit explained. Patient understood and verbalized agreement to proceed Anyone else on call: ust patient Patient location: [facility] her monique [facility] clinic This provider location: [phone]  [address], [address]; [place]; [place] [address]. [facility]. [phone]     HPI Rebecca Huff 68 y.o. -presents for follow-up.  Is a video visit to discuss outcome of her case conference.  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  It is early and mild.  There is also agreement that there was progression.  I shared these findings with her.  I said the clinical pretest probably to hear his IPF.  I did indicate to her that if it is IPF it means that she is likely to get worse with time over the next few or several years.  With early disease it might even take 7 to 10 years.  On the other hand she could rapidly decline in just a few years.  I did indicate to her that there are 3 options which includes waiting and watching for development of further progression.  I recommended against this.  The other option is to commit to antifibrotic's.  I  did indicate the general side effect nature and the properties of antifibrotic's.  The other option is to participate in clinical trial.  The other option is to go ahead with a lung biopsy to confirm the diagnosis.  She is choosing between lung biopsy versus antifibrotic/clinical trial.  Overall the conference felt quite confident about probable UIP and per latest ATS criteria this would just become IPF without having to undergo lung biopsy.  Did indicate to her that not urgent but it is important in the next few months we get the sorted out.  Did indicate to her that she needs to protect herself from respiratory infections.   We discussed echo report whether some amount of RV strain.  There is also enlarged pulmonary artery on the CT scan.  There is aortic root dilatation.  She is never seen a cardiologist.  I told her I would refer her to 1.    OV 01/10/2023  Subjective:  Patient ID: Rebecca Huff, female , DOB: [birth year] , age 41 y.o. , MRN: [id number] , [address]: [address] [place] [address] PCP Teresa Channel, MD Patient Care Team: Teresa Channel, MD as PCP - General (Family Medicine) Geronimo Amel, MD as Consulting Physician (Pulmonary Disease)  This Provider for this visit: Treatment Team:  Attending Provider: Geronimo Amel, MD    01/10/2023 -   Chief Complaint  Patient presents with   Follow-up    Sinus congestion and cough x 1 week.  Did see Dr. Ladona. Had stress test.  Review PFT from 11/09/23.   HPI Rebecca Huff 68 y.o. -returns with her husband.  He is an independent historian.  She feels stable.  Symptom score below shows stability.  In the interim she has seen Dr. Ladona for her aortic valve calcification and enlarged pulmonary artery.  In addition review of the records indicate he was diagnosed with a 4 cm ascending aortic aneurysm.  He is contemplating a nuclear stress test and then deciding whether she needs left versus right heart catheterization or  just right heart catheterization only.  She has follow-up visit with him pending.  In terms of ILD she continues to be stable.  We again visualized the images.  My personal professional opinion the onset of ILD was somewhere in 2020.  In 2017 and 2018 she might not have had ILD.  And since 2028 tad worse perhaps.  Radiology conference definitely felt it was worse.  Still her pulmonary function test today is still normal and in the exam if she has crackles then that she has very minimal crackles on the right base.  She and I and her husband we discussed options in a care which include continued monitoring versus antifibrotic therapy initiation right now.  We discussed the side effects of antifibrotic therapy though the reversible are unpleasant and can be unpleasant occasionally or periodically.  Some of the data indicate that after a few years more than 50% of patients give up on these medications.  At the same time that the patient is tolerating these medications for many years.  We discussed about getting biopsy and the 2 biopsy methods including bronchoscopy without any genomic classifier.  This is the limitations of this.  Discussed surgical lung biopsy.  Did indicate that the reason for the biopsy would be to diagnose UIP and UIP would be a negative prognostic sign and rationale for biopsy would be intention to treat early with antifibrotic's.  She did indicate that the presence of these findings do make her anxious.  Her husband prefers a slightly more conservative approach.  We resolved that we would reassess again in a few months with pulmonary function test.  This would give her time to finish her cardiac workup.  Did indicate that my bias would be to at least get a bronchoscopy with lavage and transbronchial biopsies for genomic classifier done.  Along with a MDD this would increase the sensitivity and specificity in diagnosing UIP/IPF.  And the risk would be much less compared to surgical lung biopsy.   She is understanding and accepting of this approach.  Of note for the last week or so she has had sinus complaints.  She thinks it is because of the weather no sick contacts.  There is occasional yellow drainage but mostly clear.  She feels congested.  In the interim no other medical issues.  She feels she might benefit from antibiotic and prednisone  course.   OV 07/04/2023  Subjective:  Patient ID: Rebecca Huff, female , DOB: Nov 14, 1956 , age 87 y.o. , MRN: [id number] , [address]: [address] [place] [address] PCP Teresa Channel, MD Patient Care Team: Teresa Channel, MD as PCP - General (Family Medicine) Geronimo Amel, MD as Consulting Physician (Pulmonary Disease)  This Provider for this visit: Treatment Team:  Attending Provider: Geronimo Amel, MD     07/04/2023 -   Chief Complaint  Patient presents with   Follow-up    F/up on PFT, no complaints     HPI Dnyla B Orosz 68 y.o. -presents for follow-up of very mild ILD.  She states that since her last visit she has had some respiratory exacerbations and then she had cough with clearing of the throat.  She did require some antibiotics.  But currently she is maintained both H2 blockade and PPI and with this the cough and the clearing of the throat improved significantly.  Other than this Interim Health status: No new complaints No new medical problems. No new surgeries. No ER visits. No Urgent care visits. No changes to medications. At this point in time she prefers continued follow-up.  Not interested in biopsy.  She not interested in antifibrotic's.  She did indicate to me that she would be interested in getting a CT scan and follow-up.  Review of the charts indicate that she did have a significant smoking history.  1 year CT scan time would be September 2024 because the last one  in September 2023.  She is agreed to have a high-resolution CT chest within the next 6 months when she comes back for  follow-up.        OV 03/14/2024  Subjective:  Patient ID: Rebecca Huff, female , DOB: Apr 23, 1956 , age 89 y.o. , MRN: [id number] , [address]: [address] [place] [address] PCP Teresa Channel, MD Patient Care Team: Teresa Channel, MD as PCP - General (Family Medicine) Geronimo Amel, MD as Consulting Physician (Pulmonary Disease)  This Provider for this visit: Treatment Team:  Attending Provider: Geronimo Amel, MD    03/14/2024 -   Chief Complaint  Patient presents with   Follow-up    Surgical clearance      HPI Rebecca Huff 68 y.o. -returns for follow-up sooner than expected.  Visit was scheduled for mid June 2025.  This is an acute visit but for preoperative clearance which is a new issue.  She states since July 2024 she has had moderate to severe right hip pain.  She did not mention this significantly in August 2024 visit but over the winter it got worse.  Walking from our office to the parking lot was okay but anything past that causes significant hip pain.  She has been getting some back injections as well.  Beyond this no new medical problems no hospitalizations no ER visits and surgery.  She feels shortness of breath is the same.  She had high-resolution CT chest in January 2025.  It shows possible mild progression in her ILD.  But she is acutely in need of hip surgery.  [place] and she wanted a clearance.  Her exercise hypoxemia test and her symptom score show that the ILD is stable.      LINICAL DATA:  68 year old female with history of interstitial lung disease. Follow-up study.   EXAM: CT CHEST WITHOUT CONTRAST   TECHNIQUE: Multidetector CT imaging of the chest was performed following the standard protocol without intravenous contrast. High resolution imaging of the lungs, as well as inspiratory and expiratory imaging, was performed.   RADIATION DOSE REDUCTION: This exam was performed according to  the departmental dose-optimization program which includes automated exposure control, adjustment of the mA and/or kV according to patient size and/or use of iterative reconstruction technique.   COMPARISON:  High-resolution chest CT 08/13/2022.   FINDINGS: Cardiovascular: Heart size is normal. There is no significant pericardial fluid, thickening or pericardial calcification. There is aortic atherosclerosis, as well as atherosclerosis of the great vessels of the mediastinum and the coronary arteries, including calcified atherosclerotic plaque in the right coronary artery.   Mediastinum/Nodes: No pathologically enlarged mediastinal or hilar lymph nodes. Please note that accurate exclusion of hilar adenopathy is limited on noncontrast CT scans. Esophagus is unremarkable in appearance. No axillary lymphadenopathy.   Lungs/Pleura: High-resolution imaging again demonstrates mild peripheral predominant areas of ground-glass attenuation, septal thickening, subpleural reticulation, mild cylindrical bronchiectasis and peripheral bronchiolectasis. These findings have a definitive craniocaudal gradient and are minimally progressive compared to the prior examination. No definite bronchiectasis. No acute consolidative airspace disease. No pleural effusions. No definite suspicious appearing pulmonary nodules or masses noted.   Upper Abdomen: Aortic atherosclerosis. Diffuse low attenuation throughout the visualized hepatic parenchyma, indicative of a background of hepatic steatosis.   Musculoskeletal: There are no aggressive appearing lytic or blastic lesions noted in the visualized portions of the skeleton.   IMPRESSION: 1. The appearance of the lungs is again compatible with interstitial  lung disease, with a spectrum of findings considered probable usual interstitial pneumonia (UIP) per current ATS guidelines. Findings are minimally progressive compared to the prior study. 2. Aortic  atherosclerosis, in addition to right coronary artery disease. 3. Hepatic steatosis.   Aortic Atherosclerosis (ICD10-I70.0).     Electronically Signed   By: Toribio Aye M.D.   On: 12/30/2023 11:17     OV 06/05/2024  Subjective:  Patient ID: Rebecca Huff, female , DOB: 01-Dec-1955 , age 30 y.o. , MRN: [id number] , AD[address]ESS: [address] [place] [address] PCP Teresa Channel, MD Patient Care Team: Teresa Channel, MD as PCP - General (Family Medicine) Geronimo Amel, MD as Consulting Physician (Pulmonary Disease)  This Provider for this visit: Treatment Team:  Attending Provider: Geronimo Amel, MD   ILD -subtle since 2017/2018 through 2023 with normal pulmonary function testing except for FVC reduction based on obesity.  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  Former smoker   -No description of emphysema on CT chest but on empiric Spiriva .  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  - Pulmonary artery enlargement not reported in CT chest January 2025   - Normal right heart catheterization March 2024  - Normal echo November 2023  06/05/2024 -  No chief complaint on file.    HPI Rebecca Huff 68 y.o. -      SYMPTOM SCALE - ILD 07/22/2022 09/15/2022 spiriva  01/11/2023 Supporitve care 07/04/2023    Current weight       O2 use ra ra ra ra   Shortness of Breath 0 -> 5 scale with 5 being worst (score 6 If unable to do)      At rest 0 0 0 0   Simple tasks - showers, clothes change, eating, shaving 0 0 0  0   Household (dishes, doing bed, laundry) 1 0 1 1   Shopping 0 0 0 0   Walking level at own pace 0 0 0 0   Walking up Stairs 2 1 2 1    Total (30-36) Dyspnea Score 3 1 3 2        Non-dyspnea symptoms (0-> 5 scale) 07/22/2022 09/15/2022  01/11/2023  07/04/2023    How bad is your cough? 0 0 1 0   How bad is your fatigue 000 0 2 0   How bad is nausea 0 0 0 0   How bad is vomiting?  00 0 0 0   How bad is diarrhea? 0 0 0 0   How bad is anxiety? 0 0 1 0   How bad is depression 0 2 1 0   Any chronic pain - if so where and how bad 0 0 x 0        SIT STAND TEST - goal 15 times   03/14/2024    O2 used ra   PRobe - finter or forehead fingre   Number sit and stand completed - goal 15 Yes x 15   Time taken to complete 57 seco slow   Resting Pulse Ox/HR/Dyspnea  98% and 70/min and dyspnea of 1/10    Peak measures 99 % and 100/min and dyspnea of 3/10   Final Pulse Ox/HR 97% and 81/min and dyspnea of 1/10   Desaturated </= 88% no   Desaturated <= 3% points no   Got Tachycardic >/= 90/min yes   Miscellaneous comments Mild dyspnea     CT Chest data from date: ****  - personally visualized and independently interpreted : *** - my findings are: ***   PFT     Latest Ref Rng & Units 07/04/2023   10:50 AM 01/09/2023   10:17 AM 09/06/2022    9:04 AM  PFT Results  FVC-Pre L 2.61  2.90  2.82   FVC-Predicted Pre % 72  79  75   FVC-Post L   2.87   FVC-Predicted Post %   76   Pre FEV1/FVC % % 85  82  82   Post FEV1/FCV % %   87   FEV1-Pre L 2.21  2.38  2.33   FEV1-Predicted Pre % 79  85  80   FEV1-Post L   2.48   DLCO uncorrected ml/min/mmHg 18.77  17.67  18.86   DLCO UNC% % 84  79  81   DLCO corrected ml/min/mmHg 18.77  17.67  18.86   DLCO COR %Predicted % 84  79  81   DLVA Predicted % 113  104  107   TLC L   5.06   TLC % Predicted %   88   RV % Predicted %   73        LAB RESULTS last 96 hours No results found.       has a past medical history of Anxiety, Arthritis,  Cancer (HCC), Depression, and GERD (gastroesophageal reflux disease).   reports that she quit smoking about 8 years ago. Her smoking use included cigarettes. She started smoking about 43 years ago. She has a 35 pack-year smoking history. She has never used smokeless tobacco.  Past Surgical History:  Procedure Laterality Date   ABDOMINAL HYSTERECTOMY     CESAREAN SECTION     x 2   DILATION AND CURETTAGE OF UTERUS     JOINT REPLACEMENT     MENISCUS REPAIR     left  knee   RIGHT HEART CATH N/A 01/27/2023   Procedure: RIGHT HEART CATH;  Surgeon: Ladona Heinz, MD;  Location: [facility] INVASIVE CV LAB;  Service: Cardiovascular;  Laterality: N/A;   TOTAL KNEE ARTHROPLASTY Left 10/29/2013   Dr Anderson   TOTAL KNEE ARTHROPLASTY Left 10/29/2013   Procedure: LEFT TOTAL KNEE ARTHROPLASTY;  Surgeon: Maude LELON Anderson, MD;  Location: [facility] OR;  Service: Orthopedics;  Laterality: Left;   TOTAL KNEE ARTHROPLASTY Right 04/21/2015   Procedure: TOTAL KNEE ARTHROPLASTY;  Surgeon: Maude LELON Anderson, MD;  Location: [facility] OR;  Service: Orthopedics;  Laterality: Right;   TUBAL LIGATION      No Known Allergies  Immunization History  Administered Date(s) Administered   Influenza,inj,Quad PF,6+ Mos 07/29/2021    Family History  Problem Relation Age of Onset   Hypertension Mother    Heart failure Mother    Heart disease Mother    Other Father        Farming accident   Breast cancer Sister 22   Diabetes Paternal Grandmother    Diabetes Paternal Grandfather      Current Outpatient Medications:    aspirin EC 81 MG tablet, Take 81 mg by mouth daily. Swallow whole., Disp: ,  Rfl:    atorvastatin  (LIPITOR) 40 MG tablet, TAKE 1 TABLET(40 MG) BY MOUTH DAILY, Disp: 30 tablet, Rfl: 0   Coenzyme Q10 (COQ-10) 400 MG CAPS, Take 400 mg by mouth daily., Disp: , Rfl:    dextromethorphan-guaiFENesin (MUCINEX DM) 30-600 MG 12hr tablet, Take 2 tablets by mouth 2 (two) times daily as needed for cough. Taking Mucinex AM/PM twice a day  as needed, Disp: , Rfl:    diclofenac (VOLTAREN) 75 MG EC tablet, Take 75 mg by mouth 2 (two) times daily. (Patient not taking: Reported on 03/14/2024), Disp: , Rfl:    famotidine (PEPCID) 20 MG tablet, Take 20 mg by mouth daily., Disp: , Rfl:    loratadine (CLARITIN) 10 MG tablet, Take 10 mg by mouth daily., Disp: , Rfl:    LORazepam  (ATIVAN ) 0.5 MG tablet, Take 0.5 mg by mouth at bedtime. (Patient not taking: Reported on 03/14/2024), Disp: , Rfl:    Magnesium  400 MG CAPS, Take 400 mg by mouth every evening., Disp: , Rfl:    Multiple Vitamins-Minerals (MULTIVITAMIN WITH MINERALS) tablet, Take 1 tablet by mouth daily., Disp: , Rfl:    omeprazole (PRILOSEC) 40 MG capsule, Take 40 mg by mouth every morning., Disp: , Rfl:    sertraline  (ZOLOFT ) 50 MG tablet, Take 75 mg by mouth daily., Disp: , Rfl:    traMADol (ULTRAM) 50 MG tablet, Take by mouth every 8 (eight) hours as needed., Disp: , Rfl:       Objective:   There were no vitals filed for this visit.  Estimated body mass index is 31.69 kg/m as calculated from the following:   Height as of 03/14/24: 5' 8 (1.727 m).   Weight as of 03/14/24: 208 lb 6.4 oz (94.5 kg).  @WEIGHTCHANGE @  There were no vitals filed for this visit.   Physical Exam   General: No distress. *** O2 at rest: *** Cane present: *** Sitting in wheel chair: *** Frail: *** Obese: *** Neuro: Alert and Oriented x 3. GCS 15. Speech normal Psych: Pleasant Resp:  Barrel Chest - ***.  Wheeze - ***, Crackles - ***, No overt respiratory distress CVS: Normal heart sounds. Murmurs - *** Ext: Stigmata of Connective Tissue Disease - *** HEENT: Normal upper airway. PEERL +. No post nasal drip        Assessment:     No diagnosis found.     Plan:     Patient Instructions     ICD-10-CM   1. Preoperative respiratory examination  Z01.811     2. ILD (interstitial lung disease) (HCC)  J84.9     3. Stopped smoking with greater than 30 pack year history  Z87.891        # #Preoperative risk for right hip surgery  -Low risk for pulmonary complication such as prolonged ventilator dependence always fair amount of risk for blood clots after hip surgery  Plan - Routine hip surgery - Early mobilization - Postoperative hospital stay indicated  ILD (interstitial lung disease) (HCC) Stopped smoking with greater than 30 pack year history  - early dsisease - very mild disease - probable UIP patter on CT Sept 2023 - doubt was present in 2018 and 2019 but started in 2020.  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 , progression , Prob UIP  - CTs to January 2025 with possible progression but  if so very mild progression  Plan = shared deciision making - no antifibrotic this visit  - do spirometry and dlco in 3-4 months  - Cancel June 2025 office visit and PFT - no biopsy at this time - at followup based on PFT  - if progression: start anti-fibrotic (Esbriet or Ofev)   - if stable: options are continued monitoring v biopsy (envisia  v surgical)    Followup Cancel June 2025 office visit and PFT 3-4  months - 30 minute but after PFT    FOLLOWUP No follow-ups on file.    SIGNATURE    Dr. Dorethia Cave, M.D., F.C.C.P,  Pulmonary and Critical Care Medicine Staff Physician, [facility] Director - Interstitial Lung Disease  Program  Pulmonary Fibrosis [facility] at [facility] [place], [place], [address]  Pager: [phone], If no answer or between  15:00h - 7:00h: call [phone] Telephone: [phone]  7:56 PM 06/05/2024   Moderate Complexity MDM OFFICE  2021 E/M guidelines, first released in 2021, with minor revisions added in 2023 and 2024 Must meet the requirements for 2 out of 3 dimensions to qualify.    Number and complexity of problems addressed Amount and/or  complexity of data reviewed Risk of complications and/or morbidity  One or more chronic illness with mild exacerbation, OR progression, OR  side effects of treatment  Two or more stable chronic illnesses  One undiagnosed new problem with uncertain prognosis  One acute illness with systemic symptoms   One Acute complicated injury Must meet the requirements for 1 of 3 of the categories)  Category 1: Tests and documents, historian  Any combination of 3 of the following:  Assessment requiring an independent historian  Review of prior external note(s) from each unique source  Review of results of each unique test  Ordering of each unique test    Category 2: Interpretation of tests   Independent interpretation of a test performed by another physician/other qualified health care professional (not separately reported)  Category 3: Discuss management/tests  Discussion of management or test interpretation with external physician/other qualified health care professional/appropriate source (not separately reported) Moderate risk of morbidity from additional diagnostic testing or treatment Examples only:  Prescription drug management  Decision regarding minor surgery with identfied patient or procedure risk factors  Decision regarding elective major surgery without identified patient or procedure risk factors  Diagnosis or treatment significantly limited by social determinants of health             HIGh Complexity  OFFICE   2021 E/M guidelines, first released in 2021, with minor revisions added in 2023. Must meet the requirements for 2 out of 3 dimensions to qualify.    Number and complexity of problems addressed Amount and/or complexity of data reviewed Risk of complications and/or morbidity  Severe exacerbation of chronic illness  Acute or chronic illnesses that may pose a threat to life or bodily function, e.g., multiple trauma, acute MI, pulmonary embolus, severe  respiratory distress, progressive rheumatoid arthritis, psychiatric illness with potential threat to self or others, peritonitis, acute renal failure, abrupt change in neurological status Must meet the requirements for 2 of 3 of the categories)  Category 1: Tests and documents, historian  Any combination of 3 of the following:  Assessment requiring an independent historian  Review of prior external note(s) from each unique source  Review of results of each unique test  Ordering of each unique test  Category 2: Interpretation of tests    Independent interpretation of a test performed by another physician/other qualified health care professional (not separately reported)  Category 3: Discuss management/tests  Discussion of management or test interpretation with external physician/other qualified health care professional/appropriate source (not separately reported)  HIGH risk of morbidity from additional diagnostic testing or treatment Examples only:  Drug therapy requiring intensive monitoring for toxicity  Decision for elective major surgery with identified pateint or procedure risk factors  Decision regarding hospitalization or escalation of level of care  Decision for DNR or to de-escalate care   Parenteral controlled  substances            LEGEND - Independent interpretation involves the interpretation of a test for which there is a CPT code, and an interpretation or report is customary. When a review and interpretation of a test is performed and documented by the provider, but not separately reported (billed), then this would represent an independent interpretation. This report does not need to conform to the usual standards of a complete report of the test. This does not include interpretation of tests that do not have formal reports such as a complete blood count with differential and blood cultures. Examples would include reviewing a chest radiograph  and documenting in the medical record an interpretation, but not separately reporting (billing) the interpretation of the chest radiograph.   An appropriate source includes professionals who are not health care professionals but may be involved in the management of the patient, such as a Clinical research associate, upper officer, case manager or teacher, and does not include discussion with family or informal caregivers.    - SDOH: SDOH are the conditions in the environments where people are born, live, learn, work, play, worship, and age that affect a wide range of health, functioning, and quality-of-life outcomes and risks. (e.g., housing, food insecurity, transportation, etc.). SDOH-related Z codes ranging from Z55-Z65 are the ICD-10-CM diagnosis codes used to document SDOH data Z55 - Problems related to education and literacy Z56 - Problems related to employment and unemployment Z57 - Occupational exposure to risk factors Z58 - Problems related to physical environment Z59 - Problems related to housing and economic circumstances [phone] - Problems related to social environment [phone] - Problems related to upbringing [phone] - Other problems related to primary support group, including family circumstances Z45 - Problems related to certain psychosocial circumstances Z65 - Problems related to other psychosocial circumstances

## 2024-06-06 ENCOUNTER — Encounter

## 2024-06-06 ENCOUNTER — Ambulatory Visit: Admitting: Internal Medicine

## 2024-06-06 ENCOUNTER — Encounter: Payer: Self-pay | Admitting: Internal Medicine

## 2024-06-06 VITALS — BP 126/66 | HR 86 | Temp 98.6°F | Ht 68.0 in | Wt 204.0 lb

## 2024-06-06 DIAGNOSIS — Z01811 Encounter for preprocedural respiratory examination: Secondary | ICD-10-CM | POA: Diagnosis not present

## 2024-06-06 DIAGNOSIS — Z87891 Personal history of nicotine dependence: Secondary | ICD-10-CM | POA: Diagnosis not present

## 2024-06-06 DIAGNOSIS — J849 Interstitial pulmonary disease, unspecified: Secondary | ICD-10-CM | POA: Diagnosis not present

## 2024-06-06 LAB — PULMONARY FUNCTION TEST
DL/VA % pred: 100 %
DL/VA: 4.07 ml/min/mmHg/L
DLCO unc % pred: 79 %
DLCO unc: 17.69 ml/min/mmHg
FEF 25-75 Pre: 2.33 L/s
FEF2575-%Pred-Pre: 101 %
FEV1-%Pred-Pre: 84 %
FEV1-Pre: 2.32 L
FEV1FVC-%Pred-Pre: 104 %
FEV6-%Pred-Pre: 83 %
FEV6-Pre: 2.87 L
FEV6FVC-%Pred-Pre: 103 %
FVC-%Pred-Pre: 80 %
FVC-Pre: 2.88 L
Pre FEV1/FVC ratio: 80 %
Pre FEV6/FVC Ratio: 99 %

## 2024-06-06 NOTE — Patient Instructions (Signed)
Spiro/DLCO performed today. 

## 2024-06-06 NOTE — Progress Notes (Signed)
Spiro/DLCO performed today. 

## 2024-09-05 ENCOUNTER — Other Ambulatory Visit: Payer: Self-pay | Admitting: Family Medicine

## 2024-09-05 DIAGNOSIS — Z1231 Encounter for screening mammogram for malignant neoplasm of breast: Secondary | ICD-10-CM

## 2024-10-09 ENCOUNTER — Other Ambulatory Visit: Payer: Self-pay

## 2024-10-09 DIAGNOSIS — J849 Interstitial pulmonary disease, unspecified: Secondary | ICD-10-CM

## 2024-10-14 ENCOUNTER — Encounter

## 2024-10-14 ENCOUNTER — Ambulatory Visit: Admitting: Adult Health

## 2024-10-14 ENCOUNTER — Ambulatory Visit
Admission: RE | Admit: 2024-10-14 | Discharge: 2024-10-14 | Disposition: A | Discharge status: Home / Self Care | Source: Ambulatory Visit | Attending: Family Medicine | Admitting: Family Medicine

## 2024-10-14 DIAGNOSIS — Z1231 Encounter for screening mammogram for malignant neoplasm of breast: Secondary | ICD-10-CM

## 2024-12-12 ENCOUNTER — Encounter

## 2024-12-12 ENCOUNTER — Ambulatory Visit: Admitting: Adult Health

## 2025-01-23 ENCOUNTER — Ambulatory Visit: Admitting: Adult Health

## 2025-01-23 ENCOUNTER — Encounter
